# Patient Record
Sex: Male | Born: 1955 | Race: White | Hispanic: No | Marital: Married | Smoking: Never smoker
Health system: Southern US, Community
[De-identification: ages and names within clinical notes are randomized; demographics above are authoritative.]

## PROBLEM LIST (undated history)

## (undated) DIAGNOSIS — G4730 Sleep apnea, unspecified: Secondary | ICD-10-CM

## (undated) DIAGNOSIS — E785 Hyperlipidemia, unspecified: Secondary | ICD-10-CM

## (undated) DIAGNOSIS — I10 Essential (primary) hypertension: Secondary | ICD-10-CM

## (undated) DIAGNOSIS — E669 Obesity, unspecified: Secondary | ICD-10-CM

## (undated) HISTORY — DX: Hyperlipidemia, unspecified: E78.5

## (undated) HISTORY — PX: OTHER SURGICAL HISTORY: SHX169

## (undated) HISTORY — DX: Obesity, unspecified: E66.9

## (undated) HISTORY — DX: Sleep apnea, unspecified: G47.30

## (undated) HISTORY — DX: Essential (primary) hypertension: I10

---

## 1989-03-02 HISTORY — PX: ARTHROSCOPIC REPAIR ACL: SUR80

## 2005-04-02 ENCOUNTER — Ambulatory Visit: Payer: Self-pay | Admitting: Gastroenterology

## 2005-04-13 ENCOUNTER — Ambulatory Visit: Payer: Self-pay | Admitting: Gastroenterology

## 2005-04-13 ENCOUNTER — Encounter (INDEPENDENT_AMBULATORY_CARE_PROVIDER_SITE_OTHER): Payer: Self-pay | Admitting: *Deleted

## 2008-03-02 HISTORY — PX: SHOULDER SURGERY: SHX246

## 2008-04-24 ENCOUNTER — Emergency Department (HOSPITAL_COMMUNITY): Admission: EM | Admit: 2008-04-24 | Discharge: 2008-04-24 | Payer: Self-pay | Admitting: Emergency Medicine

## 2008-11-16 ENCOUNTER — Ambulatory Visit (HOSPITAL_BASED_OUTPATIENT_CLINIC_OR_DEPARTMENT_OTHER): Admission: RE | Admit: 2008-11-16 | Discharge: 2008-11-17 | Payer: Self-pay | Admitting: Specialist

## 2010-02-26 ENCOUNTER — Ambulatory Visit: Payer: Self-pay | Admitting: Cardiology

## 2010-06-06 LAB — POCT I-STAT 4, (NA,K, GLUC, HGB,HCT)
HCT: 46 % (ref 39.0–52.0)
Hemoglobin: 15.6 g/dL (ref 13.0–17.0)

## 2010-06-09 ENCOUNTER — Telehealth: Payer: Self-pay | Admitting: Cardiology

## 2010-06-09 NOTE — Telephone Encounter (Signed)
Fax: [phone]- Last office note, stress test and EKG

## 2010-06-10 ENCOUNTER — Ambulatory Visit (HOSPITAL_BASED_OUTPATIENT_CLINIC_OR_DEPARTMENT_OTHER)
Admission: RE | Admit: 2010-06-10 | Discharge: 2010-06-11 | Disposition: A | Discharge status: Home / Self Care | Payer: 59 | Source: Ambulatory Visit | Attending: Specialist | Admitting: Specialist

## 2010-06-10 ENCOUNTER — Telehealth: Payer: Self-pay | Admitting: Cardiology

## 2010-06-10 DIAGNOSIS — M19019 Primary osteoarthritis, unspecified shoulder: Secondary | ICD-10-CM | POA: Insufficient documentation

## 2010-06-10 DIAGNOSIS — M24119 Other articular cartilage disorders, unspecified shoulder: Secondary | ICD-10-CM | POA: Insufficient documentation

## 2010-06-10 DIAGNOSIS — M25819 Other specified joint disorders, unspecified shoulder: Secondary | ICD-10-CM | POA: Insufficient documentation

## 2010-06-10 DIAGNOSIS — Z01812 Encounter for preprocedural laboratory examination: Secondary | ICD-10-CM | POA: Insufficient documentation

## 2010-06-10 DIAGNOSIS — I10 Essential (primary) hypertension: Secondary | ICD-10-CM | POA: Insufficient documentation

## 2010-06-10 DIAGNOSIS — M9420 Chondromalacia, unspecified site: Secondary | ICD-10-CM | POA: Insufficient documentation

## 2010-06-10 DIAGNOSIS — Z01810 Encounter for preprocedural cardiovascular examination: Secondary | ICD-10-CM | POA: Insufficient documentation

## 2010-06-10 DIAGNOSIS — Z79899 Other long term (current) drug therapy: Secondary | ICD-10-CM | POA: Insufficient documentation

## 2010-06-10 DIAGNOSIS — G4733 Obstructive sleep apnea (adult) (pediatric): Secondary | ICD-10-CM | POA: Insufficient documentation

## 2010-06-10 LAB — POCT I-STAT 4, (NA,K, GLUC, HGB,HCT)
Glucose, Bld: 85 mg/dL (ref 70–99)
Potassium: 3.8 mEq/L (ref 3.5–5.1)

## 2010-06-10 NOTE — Telephone Encounter (Signed)
[phone] Office note, EKG, Stress test,

## 2010-06-25 ENCOUNTER — Other Ambulatory Visit: Payer: Self-pay | Admitting: Cardiology

## 2010-06-25 DIAGNOSIS — I10 Essential (primary) hypertension: Secondary | ICD-10-CM

## 2010-06-26 NOTE — Op Note (Signed)
Cody Blankenship, Cody Blankenship               ACCOUNT NO.:  [id number]  MEDICAL RECORD NO.:  [id number]          PATIENT TYPE:  LOCATION:                                 FACILITY:  PHYSICIAN:  Erasmo Leventhal, M.D. DATE OF BIRTH:  DATE OF PROCEDURE:  06/10/2010 DATE OF DISCHARGE:                              OPERATIVE REPORT   PREOPERATIVE DIAGNOSES:  Right shoulder superior labral tearing. Destabilized biceps anchor, impingement syndrome, possible cuff tear, anterior cruciate ligament arthritis.  POSTOPERATIVE DIAGNOSES: 1. Right shoulder extensive superior labral tearing with destabilized     biceps anchor, impingement syndrome, anterior cruciate ligament     arthritis. 2. Chondromalacia of humeral head grade 2.  PROCEDURES: 1. Right shoulder glenohumeral arthroscopy with intraarticular biceps     tenotomy. 2. Intraarticular labral debridement. 3. Arthroscopic subacromial decompression with acromioplasty and bursectomy and _CA ligament release. 4. Humeral head chondroplasty.  SURGEON:  Erasmo Leventhal, MD  ASSISTANT:  Jamelle Rushing, P.A.-C.  ANESTHESIA:  Interscalene block, general.  BLOOD LOSS:  Less than 10 cc.  DRAINS:  None.  COMPLICATIONS:  None.  DISPOSITION:  PACU, stable.  DETAILS:  The patient was counseled in the holding area, correct side was identified, IV was started, interscalene block was administered. Taken to the operating room, placed in supine position under general anesthesia, turned to left lateral decubitus position Right shoulder being examined, full range of motion.  He was prepped with DuraPrep and draped in sterile fashion.  Elevated shoulder holder was utilized, 30 degrees of abduction, 10 degrees full flexion and 25 longitudinal traction due to large size of his arm.  Posterior portal was created.  __________glenohumeral joint.  Diagnostic arthroscopy was undertaken.  Grade 2 chondromalacia of the humeral head with  unstable flaps, extensive tear at the superior labrum, anterior to posterior with destabilized biceps anchor. __________ appeared to be normal.  Anterior __________rotator cuff interval.  __________ was then reduced. A light synovectomy was performed for visualization.  The biceps tendon was released with basket as we have previously discussed and then smoothed down nicely with a shaver, also gave me __________ system.  The labrum was debrided from front to back with motorized shaver and smoothed down with cautery also.  At this point in time, the superior labrum was stable, it was not displaced through the joint, shoulder was stable, arthroscopic debris was removed.  He had a very thick subacromial bursa.  __________subacromial bursectomy performed.  Rotator cuff bursa was found to be intact except for some scuffing of the __________ arch, no frank tear.  ArthroCare System was used __________ past and see a ligament, __________then placed posteriorly and anterior inferior acromioplasty performed __________ [facility] joint was found to be markedly __________with underlying subclavicular spur.  A bur was then placed through the anterior portal into [facility] joint and the lateral 5 to 8 mm flap was removed circumferentially leaving the capsule intact.  Clavicle was palpated and found to be stable.  Arthroscopic debris was removed.  Hemostasis was obtained.  He was awakened, taken to the operating room and PACU in stable condition.  Taken out of traction, port was  closed with  __________suture.  He was turned supine, awakened, he was taken operating room and PACU in stable condition.          ______________________________ Erasmo Leventhal, M.D.     RAC/MEDQ  D:  06/10/2010  T:  06/11/2010  Job:  [id number]  Electronically Signed by Eugenia Mcalpine M.D. on 06/26/2010 09:29:14 AM

## 2010-12-29 ENCOUNTER — Other Ambulatory Visit: Payer: Self-pay | Admitting: Cardiology

## 2011-04-14 ENCOUNTER — Other Ambulatory Visit: Payer: Self-pay | Admitting: Surgery

## 2011-05-01 ENCOUNTER — Other Ambulatory Visit: Payer: Self-pay | Admitting: Cardiology

## 2011-05-01 NOTE — Telephone Encounter (Signed)
Patient needs to schedule appt with lori for further refills

## 2011-05-13 ENCOUNTER — Encounter: Payer: Self-pay | Admitting: *Deleted

## 2011-05-20 ENCOUNTER — Encounter: Payer: Self-pay | Admitting: *Deleted

## 2011-08-04 ENCOUNTER — Encounter: Payer: Self-pay | Admitting: Cardiology

## 2012-06-14 ENCOUNTER — Encounter: Payer: Self-pay | Admitting: Cardiology

## 2015-04-26 ENCOUNTER — Encounter: Payer: Self-pay | Admitting: Gastroenterology

## 2015-08-22 ENCOUNTER — Encounter: Payer: Self-pay | Admitting: Gastroenterology

## 2015-10-10 ENCOUNTER — Ambulatory Visit (AMBULATORY_SURGERY_CENTER): Payer: Self-pay | Admitting: *Deleted

## 2015-10-10 VITALS — Ht 67.0 in | Wt 260.0 lb

## 2015-10-10 DIAGNOSIS — Z1211 Encounter for screening for malignant neoplasm of colon: Secondary | ICD-10-CM

## 2015-10-10 MED ORDER — NA SULFATE-K SULFATE-MG SULF 17.5-3.13-1.6 GM/177ML PO SOLN: ORAL | 0 refills | Status: DC

## 2015-10-10 NOTE — Progress Notes (Signed)
Patient denies any problems with anesthesia/sedation. Patient denies any oxygen use at home and does not take any diet/weight loss medications.

## 2015-10-24 ENCOUNTER — Encounter: Payer: Self-pay | Admitting: Gastroenterology

## 2015-11-06 ENCOUNTER — Encounter: Payer: Self-pay | Admitting: Gastroenterology

## 2015-11-20 ENCOUNTER — Ambulatory Visit (AMBULATORY_SURGERY_CENTER): Payer: 59 | Admitting: Gastroenterology

## 2015-11-20 ENCOUNTER — Encounter: Payer: Self-pay | Admitting: Gastroenterology

## 2015-11-20 VITALS — BP 126/79 | HR 61 | Temp 98.4°F | Resp 10 | Ht 67.0 in | Wt 260.0 lb

## 2015-11-20 DIAGNOSIS — Z1211 Encounter for screening for malignant neoplasm of colon: Secondary | ICD-10-CM

## 2015-11-20 MED ORDER — SODIUM CHLORIDE 0.9 % IV SOLN: 500.0000 mL | INTRAVENOUS | Status: AC

## 2015-11-20 NOTE — Patient Instructions (Signed)
Handouts given on diverticulosis and hemorrhoids. Repeat colonoscopy in 10 years. Resume current medications. Call us with any questions or concerns. Thank you!!   YOU HAD AN ENDOSCOPIC PROCEDURE TODAY AT THE [facility]:   Refer to the procedure report that was given to you for any specific questions about what was found during the examination.  If the procedure report does not answer your questions, please call your gastroenterologist to clarify.  If you requested that your care partner not be given the details of your procedure findings, then the procedure report has been included in a sealed envelope for you to review at your convenience later.  YOU SHOULD EXPECT: Some feelings of bloating in the abdomen. Passage of more gas than usual.  Walking can help get rid of the air that was put into your GI tract during the procedure and reduce the bloating. If you had a lower endoscopy (such as a colonoscopy or flexible sigmoidoscopy) you may notice spotting of blood in your stool or on the toilet paper. If you underwent a bowel prep for your procedure, you may not have a normal bowel movement for a few days.  Please Note:  You might notice some irritation and congestion in your nose or some drainage.  This is from the oxygen used during your procedure.  There is no need for concern and it should clear up in a day or so.  SYMPTOMS TO REPORT IMMEDIATELY:   Following lower endoscopy (colonoscopy or flexible sigmoidoscopy):  Excessive amounts of blood in the stool  Significant tenderness or worsening of abdominal pains  Swelling of the abdomen that is new, acute  Fever of 100F or higher  For urgent or emergent issues, a gastroenterologist can be reached at any hour by calling [phone].   DIET:  We do recommend a small meal at first, but then you may proceed to your regular diet.  Drink plenty of fluids but you should avoid alcoholic beverages for 24 hours.  ACTIVITY:  You should  plan to take it easy for the rest of today and you should NOT DRIVE or use heavy machinery until tomorrow (because of the sedation medicines used during the test).    FOLLOW UP: Our staff will call the number listed on your records the next business day following your procedure to check on you and address any questions or concerns that you may have regarding the information given to you following your procedure. If we do not reach you, we will leave a message.  However, if you are feeling well and you are not experiencing any problems, there is no need to return our call.  We will assume that you have returned to your regular daily activities without incident.  If any biopsies were taken you will be contacted by phone or by letter within the next 1-3 weeks.  Please call us at [phone] if you have not heard about the biopsies in 3 weeks.    SIGNATURES/CONFIDENTIALITY: You and/or your care partner have signed paperwork which will be entered into your electronic medical record.  These signatures attest to the fact that that the information above on your After Visit Summary has been reviewed and is understood.  Full responsibility of the confidentiality of this discharge information lies with you and/or your care-partner.

## 2015-11-20 NOTE — Op Note (Signed)
[facility] Patient Name: Cody KittenBrian Lentz Procedure Date: 11/20/2015 9:21 AM MRN: [id number] Endoscopist: Napoleon FormKavitha V. Teola Felipe , MD Age: 60 Referring MD:  Date of Birth: 05/29/1955 Gender: Male Account #: [id number] Procedure:                Colonoscopy Indications:              Screening for colorectal malignant neoplasm Medicines:                Monitored Anesthesia Care Procedure:                Pre-Anesthesia Assessment:                           - Prior to the procedure, a History and Physical                            was performed, and patient medications and                            allergies were reviewed. The patient's tolerance of                            previous anesthesia was also reviewed. The risks                            and benefits of the procedure and the sedation                            options and risks were discussed with the patient.                            All questions were answered, and informed consent                            was obtained. Prior Anticoagulants: The patient has                            taken no previous anticoagulant or antiplatelet                            agents. ASA Grade Assessment: II - A patient with                            mild systemic disease. After reviewing the risks                            and benefits, the patient was deemed in                            satisfactory condition to undergo the procedure.                           After obtaining informed consent, the colonoscope  was passed under direct vision. Throughout the                            procedure, the patient's blood pressure, pulse, and                            oxygen saturations were monitored continuously. The                            Model CF-HQ190L [id number]) scope was introduced                            through the anus and advanced to the the cecum,                            identified by  appendiceal orifice and ileocecal                            valve. The colonoscopy was performed without                            difficulty. The patient tolerated the procedure                            well. The quality of the bowel preparation was                            good. The terminal ileum, ileocecal valve,                            appendiceal orifice, and rectum were photographed. Scope In: 9:25:10 AM Scope Out: 9:39:27 AM Scope Withdrawal Time: 0 hours 10 minutes 24 seconds  Total Procedure Duration: 0 hours 14 minutes 17 seconds  Findings:                 The perianal and digital rectal examinations were                            normal.                           Multiple small and large-mouthed diverticula were                            found in the sigmoid colon and descending colon.                            Peri-diverticular erythema was seen. Petechia were                            visualized in association with the diverticular                            opening. There was no evidence of diverticular  bleeding.                           Non-bleeding internal hemorrhoids were found during                            retroflexion. The hemorrhoids were small.                           The exam was otherwise without abnormality. Complications:            No immediate complications. Estimated Blood Loss:     Estimated blood loss: none. Impression:               - Moderate diverticulosis in the sigmoid colon and                            in the descending colon. Peri-diverticular erythema                            was seen. Petechia were visualized in association                            with the diverticular opening. There was no                            evidence of diverticular bleeding.                           - Non-bleeding internal hemorrhoids.                           - The examination was otherwise normal.                            - No specimens collected. Recommendation:           - Patient has a contact number available for                            emergencies. The signs and symptoms of potential                            delayed complications were discussed with the                            patient. Return to normal activities tomorrow.                            Written discharge instructions were provided to the                            patient.                           - Resume previous diet.                           -  Continue present medications.                           - Repeat colonoscopy in 10 years for screening                            purposes. Napoleon Form, MD 11/20/2015 9:54:22 AM This report has been signed electronically.

## 2015-11-21 ENCOUNTER — Telehealth: Payer: Self-pay | Admitting: *Deleted

## 2015-11-21 NOTE — Telephone Encounter (Signed)
  Follow up Call-  Call back number 11/20/2015  Post procedure Call Back phone  # [phone]  Permission to leave phone message Yes  Some recent data might be hidden     Patient questions:  Do you have a fever, pain , or abdominal swelling? No. Pain Score  0 *  Have you tolerated food without any problems? Yes.    Have you been able to return to your normal activities? Yes.    Do you have any questions about your discharge instructions: Diet   No. Medications  No. Follow up visit  No.  Do you have questions or concerns about your Care? No.  Actions: * If pain score is 4 or above: No action needed, pain <4.

## 2016-03-12 DIAGNOSIS — M75102 Unspecified rotator cuff tear or rupture of left shoulder, not specified as traumatic: Secondary | ICD-10-CM | POA: Diagnosis not present

## 2016-03-12 DIAGNOSIS — S43432A Superior glenoid labrum lesion of left shoulder, initial encounter: Secondary | ICD-10-CM | POA: Diagnosis not present

## 2016-03-12 DIAGNOSIS — M24012 Loose body in left shoulder: Secondary | ICD-10-CM | POA: Diagnosis not present

## 2016-03-12 DIAGNOSIS — G8918 Other acute postprocedural pain: Secondary | ICD-10-CM | POA: Diagnosis not present

## 2016-03-12 DIAGNOSIS — M7532 Calcific tendinitis of left shoulder: Secondary | ICD-10-CM | POA: Diagnosis not present

## 2016-03-12 DIAGNOSIS — M66812 Spontaneous rupture of other tendons, left shoulder: Secondary | ICD-10-CM | POA: Diagnosis not present

## 2016-03-12 DIAGNOSIS — M67912 Unspecified disorder of synovium and tendon, left shoulder: Secondary | ICD-10-CM | POA: Diagnosis not present

## 2016-03-20 DIAGNOSIS — M25512 Pain in left shoulder: Secondary | ICD-10-CM | POA: Diagnosis not present

## 2016-03-27 DIAGNOSIS — I10 Essential (primary) hypertension: Secondary | ICD-10-CM | POA: Diagnosis not present

## 2016-03-27 DIAGNOSIS — G4733 Obstructive sleep apnea (adult) (pediatric): Secondary | ICD-10-CM | POA: Diagnosis not present

## 2016-03-27 DIAGNOSIS — G4730 Sleep apnea, unspecified: Secondary | ICD-10-CM | POA: Diagnosis not present

## 2016-04-27 DIAGNOSIS — I10 Essential (primary) hypertension: Secondary | ICD-10-CM | POA: Diagnosis not present

## 2016-04-27 DIAGNOSIS — G4733 Obstructive sleep apnea (adult) (pediatric): Secondary | ICD-10-CM | POA: Diagnosis not present

## 2016-04-27 DIAGNOSIS — G4730 Sleep apnea, unspecified: Secondary | ICD-10-CM | POA: Diagnosis not present

## 2016-05-25 DIAGNOSIS — R05 Cough: Secondary | ICD-10-CM | POA: Diagnosis not present

## 2016-05-25 DIAGNOSIS — G4730 Sleep apnea, unspecified: Secondary | ICD-10-CM | POA: Diagnosis not present

## 2016-05-25 DIAGNOSIS — I10 Essential (primary) hypertension: Secondary | ICD-10-CM | POA: Diagnosis not present

## 2016-05-25 DIAGNOSIS — G4733 Obstructive sleep apnea (adult) (pediatric): Secondary | ICD-10-CM | POA: Diagnosis not present

## 2016-06-25 DIAGNOSIS — G4733 Obstructive sleep apnea (adult) (pediatric): Secondary | ICD-10-CM | POA: Diagnosis not present

## 2016-06-25 DIAGNOSIS — G4730 Sleep apnea, unspecified: Secondary | ICD-10-CM | POA: Diagnosis not present

## 2016-06-25 DIAGNOSIS — I10 Essential (primary) hypertension: Secondary | ICD-10-CM | POA: Diagnosis not present

## 2016-12-26 DIAGNOSIS — Z23 Encounter for immunization: Secondary | ICD-10-CM | POA: Diagnosis not present

## 2017-01-15 DIAGNOSIS — Z0000 Encounter for general adult medical examination without abnormal findings: Secondary | ICD-10-CM | POA: Diagnosis not present

## 2017-01-15 DIAGNOSIS — I10 Essential (primary) hypertension: Secondary | ICD-10-CM | POA: Diagnosis not present

## 2017-01-15 DIAGNOSIS — Z125 Encounter for screening for malignant neoplasm of prostate: Secondary | ICD-10-CM | POA: Diagnosis not present

## 2017-01-25 DIAGNOSIS — R7301 Impaired fasting glucose: Secondary | ICD-10-CM | POA: Diagnosis not present

## 2017-01-25 DIAGNOSIS — B3789 Other sites of candidiasis: Secondary | ICD-10-CM | POA: Diagnosis not present

## 2017-01-25 DIAGNOSIS — Z1389 Encounter for screening for other disorder: Secondary | ICD-10-CM | POA: Diagnosis not present

## 2017-01-25 DIAGNOSIS — Z0000 Encounter for general adult medical examination without abnormal findings: Secondary | ICD-10-CM | POA: Diagnosis not present

## 2017-01-25 DIAGNOSIS — R05 Cough: Secondary | ICD-10-CM | POA: Diagnosis not present

## 2017-01-25 DIAGNOSIS — Z125 Encounter for screening for malignant neoplasm of prostate: Secondary | ICD-10-CM | POA: Diagnosis not present

## 2017-01-26 DIAGNOSIS — G4733 Obstructive sleep apnea (adult) (pediatric): Secondary | ICD-10-CM | POA: Diagnosis not present

## 2017-01-26 DIAGNOSIS — G4730 Sleep apnea, unspecified: Secondary | ICD-10-CM | POA: Diagnosis not present

## 2017-02-05 DIAGNOSIS — Z1212 Encounter for screening for malignant neoplasm of rectum: Secondary | ICD-10-CM | POA: Diagnosis not present

## 2017-04-16 DIAGNOSIS — J029 Acute pharyngitis, unspecified: Secondary | ICD-10-CM | POA: Diagnosis not present

## 2017-04-16 DIAGNOSIS — R509 Fever, unspecified: Secondary | ICD-10-CM | POA: Diagnosis not present

## 2017-04-16 DIAGNOSIS — R05 Cough: Secondary | ICD-10-CM | POA: Diagnosis not present

## 2017-08-09 DIAGNOSIS — M25561 Pain in right knee: Secondary | ICD-10-CM | POA: Diagnosis not present

## 2017-08-09 DIAGNOSIS — M238X1 Other internal derangements of right knee: Secondary | ICD-10-CM | POA: Diagnosis not present

## 2017-08-17 DIAGNOSIS — M25561 Pain in right knee: Secondary | ICD-10-CM | POA: Diagnosis not present

## 2017-08-25 DIAGNOSIS — M1711 Unilateral primary osteoarthritis, right knee: Secondary | ICD-10-CM | POA: Diagnosis not present

## 2017-08-25 DIAGNOSIS — M2351 Chronic instability of knee, right knee: Secondary | ICD-10-CM | POA: Diagnosis not present

## 2017-09-22 DIAGNOSIS — M1711 Unilateral primary osteoarthritis, right knee: Secondary | ICD-10-CM | POA: Diagnosis not present

## 2017-10-13 DIAGNOSIS — M1711 Unilateral primary osteoarthritis, right knee: Secondary | ICD-10-CM | POA: Diagnosis not present

## 2017-11-27 DIAGNOSIS — Z23 Encounter for immunization: Secondary | ICD-10-CM | POA: Diagnosis not present

## 2018-01-20 DIAGNOSIS — R82998 Other abnormal findings in urine: Secondary | ICD-10-CM | POA: Diagnosis not present

## 2018-01-20 DIAGNOSIS — Z0000 Encounter for general adult medical examination without abnormal findings: Secondary | ICD-10-CM | POA: Diagnosis not present

## 2018-01-20 DIAGNOSIS — Z125 Encounter for screening for malignant neoplasm of prostate: Secondary | ICD-10-CM | POA: Diagnosis not present

## 2018-01-26 DIAGNOSIS — Z0000 Encounter for general adult medical examination without abnormal findings: Secondary | ICD-10-CM | POA: Diagnosis not present

## 2018-01-26 DIAGNOSIS — R7301 Impaired fasting glucose: Secondary | ICD-10-CM | POA: Diagnosis not present

## 2018-01-26 DIAGNOSIS — Z1389 Encounter for screening for other disorder: Secondary | ICD-10-CM | POA: Diagnosis not present

## 2018-01-26 DIAGNOSIS — R05 Cough: Secondary | ICD-10-CM | POA: Diagnosis not present

## 2018-01-26 DIAGNOSIS — Z1212 Encounter for screening for malignant neoplasm of rectum: Secondary | ICD-10-CM | POA: Diagnosis not present

## 2018-01-26 DIAGNOSIS — B379 Candidiasis, unspecified: Secondary | ICD-10-CM | POA: Diagnosis not present

## 2018-04-07 DIAGNOSIS — J209 Acute bronchitis, unspecified: Secondary | ICD-10-CM | POA: Diagnosis not present

## 2019-05-08 ENCOUNTER — Ambulatory Visit: Payer: 59 | Attending: Internal Medicine

## 2019-05-08 DIAGNOSIS — Z23 Encounter for immunization: Secondary | ICD-10-CM | POA: Insufficient documentation

## 2019-05-08 NOTE — Progress Notes (Signed)
   Covid-19 Vaccination Clinic  Name:  Cody Blankenship    MRN: [id number] DOB: 10/26/1955  05/08/2019  Mr. Cody Blankenship was observed post Covid-19 immunization for 15 minutes without incident. He was provided with Vaccine Information Sheet and instruction to access the V-Safe system.   Mr. Cody Blankenship was instructed to call 911 with any severe reactions post vaccine: Marland Kitchen Difficulty breathing  . Swelling of face and throat  . A fast heartbeat  . A bad rash all over body  . Dizziness and weakness   Immunizations Administered    Name Date Dose VIS Date Route   Pfizer COVID-19 Vaccine 05/08/2019  2:14 PM 0.3 mL 02/10/2019 Intramuscular   Manufacturer: [employer], [employer]   Lot: [id number]   NDC: [address]-2

## 2019-06-07 ENCOUNTER — Ambulatory Visit: Payer: 59 | Attending: Internal Medicine

## 2019-06-07 DIAGNOSIS — Z23 Encounter for immunization: Secondary | ICD-10-CM

## 2019-06-07 NOTE — Progress Notes (Signed)
   Covid-19 Vaccination Clinic  Name:  ORLIN KANN    MRN: [id number] DOB: [birth year]  06/07/2019  Mr. Decicco was observed post Covid-19 immunization for 15 minutes without incident. He was provided with Vaccine Information Sheet and instruction to access the V-Safe system.   Mr. Pagliarulo was instructed to call 911 with any severe reactions post vaccine: Marland Kitchen Difficulty breathing  . Swelling of face and throat  . A fast heartbeat  . A bad rash all over body  . Dizziness and weakness   Immunizations Administered    Name Date Dose VIS Date Route   Pfizer COVID-19 Vaccine 06/07/2019 12:38 PM 0.3 mL 02/10/2019 Intramuscular   Manufacturer: [employer], [employer]   Lot: [id number]   NDC: [address]-7

## 2020-04-10 ENCOUNTER — Other Ambulatory Visit: Payer: Self-pay | Admitting: Internal Medicine

## 2020-04-10 DIAGNOSIS — E785 Hyperlipidemia, unspecified: Secondary | ICD-10-CM

## 2020-05-01 ENCOUNTER — Ambulatory Visit
Admission: RE | Admit: 2020-05-01 | Discharge: 2020-05-01 | Disposition: A | Discharge status: Home / Self Care | Payer: No Typology Code available for payment source | Source: Ambulatory Visit | Attending: Internal Medicine | Admitting: Internal Medicine

## 2020-05-01 DIAGNOSIS — E785 Hyperlipidemia, unspecified: Secondary | ICD-10-CM

## 2021-06-05 ENCOUNTER — Encounter: Payer: Self-pay | Admitting: Pulmonary Disease

## 2021-06-05 ENCOUNTER — Ambulatory Visit: Payer: 59 | Admitting: Pulmonary Disease

## 2021-06-05 VITALS — BP 110/90 | HR 95 | Ht 65.5 in | Wt 274.6 lb

## 2021-06-05 DIAGNOSIS — R062 Wheezing: Secondary | ICD-10-CM | POA: Diagnosis not present

## 2021-06-05 DIAGNOSIS — J45909 Unspecified asthma, uncomplicated: Secondary | ICD-10-CM | POA: Insufficient documentation

## 2021-06-05 MED ORDER — ADVAIR HFA 115-21 MCG/ACT IN AERO: 2.0000 | INHALATION_SPRAY | Freq: Two times a day (BID) | RESPIRATORY_TRACT | 1 refills | Status: DC

## 2021-06-05 NOTE — Progress Notes (Addendum)
? ? ?Subjective:  ? ?PATIENT ID: Cody Blankenship GENDER: male DOB: 1955/04/12, MRN: [id number] ? ? ?HPI ? ?Chief Complaint  ?Patient presents with  ? Consult  ?  Wheezing over a yr  ? ? ?Reason for Visit: New consult for asthma ? ?Mr. Cody Blankenship is a 66 year old male with childhood asthma, OSA on CPAP, hyperlipidemia, hypertension who presents as a new consult for asthma. ? ?He reports childhood asthma that limited his ability to play sports until [employer]. He reports multiple hospitalizations until he aged out at 66 years old. No symptoms as a teenager or young adult. In the last years he develops wheezing during the day and night. Has noticed worsening congestion. Denies shortness of breath or cough. Denies chest tightness/pain. No symptoms with moderate or heavy exertion with lifting. Does have difficulty walking up steps due to his knees. He uses albuterol up to 1-2 times a day. He is compliant with his CPAP nightly. His last significant respiratory illness was Feb 2020 after [place], cannot recall if his symptoms started then. No covid test available then. ? ?Social History: ?Retired police ?Never smoker ?Second smoke exposure  ? ?Environmental exposures: None ? ?I have personally reviewed patient's past medical/family/social history, allergies, current medications. ? ?Past Medical History:  ?Diagnosis Date  ? Hyperlipidemia   ? Hypertension   ? Obesity   ? Sleep apnea   ? CPAP  ?  ? ?Family History  ?Problem Relation Age of Onset  ? Breast cancer Mother   ? Ovarian cancer Mother   ? Colon cancer Neg Hx   ?  ? ?Social History  ? ?Occupational History  ? Not on file  ?Tobacco Use  ? Smoking status: Never  ?  Passive exposure: Past  ? Smokeless tobacco: Never  ? Tobacco comments:  ?  Chew tobacco for a long time and stopped nov/dec 2022  ?Substance and Sexual Activity  ? Alcohol use: Yes  ?  Alcohol/week: 28.0 standard drinks  ?  Types: 28 Cans of beer per week  ? Drug use: No  ? Sexual activity: Not on file   ? ? ?Allergies  ?Allergen Reactions  ? Erythromycin Other (See Comments)  ?  Pt unsure reason  ?  ? ?Outpatient Medications Prior to Visit  ?Medication Sig Dispense Refill  ? atorvastatin (LIPITOR) 10 MG tablet Take 1 tablet by mouth daily.    ? losartan (COZAAR) 50 MG tablet Take 50 mg by mouth daily.    ? metoprolol succinate (TOPROL-XL) 50 MG 24 hr tablet Take 50 mg by mouth daily. Take with or immediately following a meal.    ? OZEMPIC, 1 MG/DOSE, 4 MG/3ML SOPN Inject 1 mg into the skin once a week.    ? tadalafil (CIALIS) 10 MG tablet Take 10 mg by mouth daily as needed for erectile dysfunction.    ? SYMBICORT 80-4.5 MCG/ACT inhaler SMARTSIG:2 Puff(s) By Mouth Twice Daily    ? aspirin 81 MG tablet Take 81 mg by mouth daily. (Patient not taking: Reported on 06/05/2021)    ? LIPITOR 10 MG tablet TAKE 1 TABLET BY MOUTH EVERY DAY 30 tablet 1  ? losartan (COZAAR) 50 MG tablet Take 2 tablets by mouth daily.    ? ?Facility-Administered Medications Prior to Visit  ?Medication Dose Route Frequency Provider Last Rate Last Admin  ? 0.9 %  sodium chloride infusion  500 mL Intravenous Continuous Nandigam, Eleonore Chiquito, MD      ? ? ?Review of Systems  ?  Constitutional:  Negative for chills, diaphoresis, fever, malaise/fatigue and weight loss.  ?HENT:  Positive for congestion.   ?Respiratory:  Positive for shortness of breath and wheezing. Negative for cough, hemoptysis and sputum production.   ?Cardiovascular:  Negative for chest pain, palpitations and leg swelling.  ? ? ?Objective:  ? ?Vitals:  ? 06/05/21 0925  ?BP: 110/90  ?Pulse: 95  ?SpO2: 98%  ?Weight: 274 lb 9.6 oz (124.6 kg)  ?Height: 5' 5.5" (1.664 m)  ? ?SpO2: 98 % ?O2 Device: None (Room air) ? ?Physical Exam: ?General: Well-appearing, no acute distress ?HENT: [place], AT ?Eyes: EOMI, no scleral icterus ?Respiratory: Clear to auscultation bilaterally.  No crackles, wheezing or rales ?Cardiovascular: RRR, -M/R/G, no JVD ?Extremities:-Edema,-tenderness ?Neuro: AAO x4, CNII-XII  grossly intact ?Psych: Normal mood, normal affect ? ?Data Reviewed: ? ?Imaging: ?CT cardiac 05/01/2020-calcium score 0.  Visualized lung fields with normal parenchyma.  No pulmonary nodules masses or infiltrates ? ?PFT: ?None on file ? ?Labs: ?CBC ?   ?Component Value Date/Time  ? HGB 15.0 06/10/2010 1307  ? HCT 44.0 06/10/2010 1307  ?OSH 04/10/21 Abs eos -300 ? ?Health Maintenance: ?Immunization History  ?Administered Date(s) Administered  ? Influenza,inj,Quad PF,6+ Mos 01/21/2016, 12/26/2016  ? PFIZER(Purple Top)SARS-COV-2 Vaccination 05/08/2019, 06/07/2019  ? Pneumococcal Polysaccharide-23 04/05/2020  ? Td 03/02/2010, 08/15/2010, 01/12/2012  ? ?CT Chest Lung Screen - not qualified ? ?   ?Assessment & Plan:  ? ?Discussion: ?66 year old male with OSA on CPAP, hyperlipidemia, hypertension who presents as a new consult for asthma. Discussed clinical course and management of asthma including bronchodilator regimen and action plan for exacerbation. ? ?Wheezing ?--ORDER pulmonary function tests ?--START Advair HFA 115-21 mcg TWO puffs TWICE a day ?--CONTINUE Albuterol AS NEEDED for wheezing ? ? ?Orders Placed This Encounter  ?Procedures  ? Pulmonary function test  ?  Standing Status:   Future  ?  Standing Expiration Date:   06/06/2022  ?  Order Specific Question:   Where should this test be performed?  ?  Answer:   [facility]  ?  Order Specific Question:   Full PFT: includes the following: basic spirometry, spirometry pre & post bronchodilator, diffusion capacity (DLCO), lung volumes  ?  Answer:   Full PFT  ? ?Meds ordered this encounter  ?Medications  ? fluticasone-salmeterol (ADVAIR HFA) 115-21 MCG/ACT inhaler  ?  Sig: Inhale 2 puffs into the lungs 2 (two) times daily.  ?  Dispense:  1 each  ?  Refill:  1  ? ? ?No follow-ups on file. After PFTs ? ?I have spent a total time of 45-minutes on the day of the appointment reviewing prior documentation, coordinating care and discussing medical diagnosis and plan with the  patient/family. Imaging, labs and tests included in this note have been reviewed and interpreted independently by me. ? ?Doryce Mcgregory Mechele Collin, MD ?[facility] ?06/05/2021 5:01 PM  ?Office Number [phone] ? ? ?

## 2021-06-05 NOTE — Patient Instructions (Signed)
?  Wheezing ?--ORDER pulmonary function tests ?--START Advair HFA 115-21 mcg TWO puffs TWICE a day ?--CONTINUE Albuterol AS NEEDED for wheezing ? ?Follow-up with me in May/June. Arrange PFTs prior to visit ?

## 2021-07-22 ENCOUNTER — Ambulatory Visit (INDEPENDENT_AMBULATORY_CARE_PROVIDER_SITE_OTHER): Payer: 59 | Admitting: Pulmonary Disease

## 2021-07-22 DIAGNOSIS — R062 Wheezing: Secondary | ICD-10-CM | POA: Diagnosis not present

## 2021-07-22 LAB — PULMONARY FUNCTION TEST
DL/VA % pred: 125 %
DL/VA: 5.3 ml/min/mmHg/L
DLCO cor % pred: 102 %
DLCO cor: 24.99 ml/min/mmHg
DLCO unc % pred: 102 %
DLCO unc: 24.99 ml/min/mmHg
FEF 25-75 Post: 2.95 L/sec
FEF 25-75 Pre: 2.15 L/sec
FEF2575-%Change-Post: 37 %
FEF2575-%Pred-Post: 117 %
FEF2575-%Pred-Pre: 85 %
FEV1-%Change-Post: 6 %
FEV1-%Pred-Post: 81 %
FEV1-%Pred-Pre: 76 %
FEV1-Post: 2.5 L
FEV1-Pre: 2.34 L
FEV1FVC-%Change-Post: 3 %
FEV1FVC-%Pred-Pre: 106 %
FEV6-%Change-Post: 3 %
FEV6-%Pred-Post: 77 %
FEV6-%Pred-Pre: 74 %
FEV6-Post: 3.01 L
FEV6-Pre: 2.91 L
FEV6FVC-%Change-Post: 0 %
FEV6FVC-%Pred-Post: 105 %
FEV6FVC-%Pred-Pre: 105 %
FVC-%Change-Post: 3 %
FVC-%Pred-Post: 73 %
FVC-%Pred-Pre: 71 %
FVC-Post: 3.01 L
FVC-Pre: 2.91 L
Post FEV1/FVC ratio: 83 %
Post FEV6/FVC ratio: 100 %
Pre FEV1/FVC ratio: 80 %
Pre FEV6/FVC Ratio: 100 %
RV % pred: 101 %
RV: 2.15 L
TLC % pred: 83 %
TLC: 5.24 L

## 2021-07-22 NOTE — Progress Notes (Signed)
PFT done today. 

## 2021-07-29 ENCOUNTER — Other Ambulatory Visit: Payer: Self-pay | Admitting: Pulmonary Disease

## 2021-07-30 ENCOUNTER — Encounter: Payer: Self-pay | Admitting: Pulmonary Disease

## 2021-07-30 ENCOUNTER — Ambulatory Visit: Payer: 59 | Admitting: Pulmonary Disease

## 2021-07-30 VITALS — BP 150/80 | HR 97 | Temp 98.2°F | Ht 66.0 in | Wt 275.0 lb

## 2021-07-30 DIAGNOSIS — J4530 Mild persistent asthma, uncomplicated: Secondary | ICD-10-CM

## 2021-07-30 NOTE — Progress Notes (Signed)
Subjective:   PATIENT ID: Cody Blankenship GENDER: male DOB: [birth year], MRN: [id number]   HPI  Chief Complaint  Patient presents with   Follow-up    Patient would like to go over PFT, otherwise doing good.    Reason for Visit: Follow-up  Mr. Geroge Gilliam is a 66 year old male with childhood asthma, OSA on CPAP, hyperlipidemia, hypertension who presents for asthma follow-up.  He reports childhood asthma that limited his ability to play sports until [place]. High. He reports multiple hospitalizations until he aged out at 66 years old. No symptoms as a teenager or young adult. In the last years he develops wheezing during the day and night. Has noticed worsening congestion. Denies shortness of breath or cough. Denies chest tightness/pain. No symptoms with moderate or heavy exertion with lifting. Does have difficulty walking up steps due to his knees. He uses albuterol up to 1-2 times a day. He is compliant with his CPAP nightly. His last significant respiratory illness was Feb 2020 after [place], cannot recall if his symptoms started then. No covid test available then.  07/30/21 Since last visit Advair has improved symptoms. He takes daily. Wheezing at night has improved. Able to perform ADLs. Has not used an albuterol. 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 Second smoke exposure   Past Medical History:  Diagnosis Date   Hyperlipidemia    Hypertension    Obesity    Sleep apnea    CPAP     Family History  Problem Relation Age of Onset   Breast cancer Mother    Ovarian cancer Mother    Colon cancer Neg Hx      Social History   Occupational History   Not on file  Tobacco Use   Smoking status: Never    Passive exposure: Past   Smokeless tobacco: Former    Types: Chew    Quit date: 02/09/2021   Tobacco comments:    Dorna Bloom tobacco for  a long time and stopped nov/dec 2022  Vaping Use   Vaping Use: Never used  Substance and Sexual Activity   Alcohol use: Yes    Alcohol/week: 28.0 standard drinks    Types: 28 Cans of beer per week   Drug use: No   Sexual activity: Not on file    Allergies  Allergen Reactions   Erythromycin Other (See Comments)    Pt unsure reason     Outpatient Medications Prior to Visit  Medication Sig Dispense Refill   ADVAIR HFA 115-21 MCG/ACT inhaler INHALE 2 PUFFS INTO THE LUNGS TWICE A DAY 12 each 1   aspirin 81 MG tablet Take 81 mg by mouth daily.     atorvastatin (LIPITOR) 10 MG tablet Take 1 tablet by mouth daily.     LIPITOR 10 MG tablet TAKE 1 TABLET BY MOUTH EVERY DAY 30 tablet 1   losartan (COZAAR) 50 MG tablet Take 50 mg by mouth daily.     losartan (COZAAR) 50 MG tablet Take 2 tablets by mouth daily.     metoprolol succinate (TOPROL-XL) 50 MG 24 hr tablet Take 50 mg by mouth daily. Take with or immediately following a meal.     OZEMPIC, 1 MG/DOSE, 4 MG/3ML SOPN Inject 1 mg into the skin once a week.     tadalafil (CIALIS) 10 MG tablet Take 10 mg  by mouth daily as needed for erectile dysfunction.     Facility-Administered Medications Prior to Visit  Medication Dose Route Frequency Provider Last Rate Last Admin   0.9 %  sodium chloride infusion  500 mL Intravenous Continuous Nandigam, Kavitha V, MD        Review of Systems  Constitutional:  Negative for chills, diaphoresis, fever, malaise/fatigue and weight loss.  HENT:  Negative for congestion.   Respiratory:  Positive for shortness of breath. Negative for cough, hemoptysis, sputum production and wheezing.   Cardiovascular:  Negative for chest pain, palpitations and leg swelling.    Objective:   Vitals:   07/30/21 0906  BP: (!) 150/80  Pulse: 97  Temp: 98.2 F (36.8 C)  TempSrc: Oral  SpO2: 95%  Weight: 275 lb (124.7 kg)  Height: 5\' 6"  (1.676 m)   SpO2: 95 % O2 Device: None (Room air)  Physical Exam: General:  Well-appearing, no acute distress HENT: [place], AT Eyes: EOMI, no scleral icterus Respiratory: Clear to auscultation bilaterally.  No crackles, wheezing or rales Cardiovascular: RRR, -M/R/G, no JVD Extremities:-Edema,-tenderness Neuro: AAO x4, CNII-XII grossly intact Psych: Normal mood, normal affect  Data Reviewed:  Imaging: CT cardiac 05/01/2020-calcium score 0.  Visualized lung fields with normal parenchyma.  No pulmonary nodules masses or infiltrates  PFT: 07/22/2021 FVC 3.01 (73%) FEV1 2.5 (81%) ratio 80 TLC 83% DLCO 102% Interpretation: Normal PFTs.  No obstructive or restrictive defect on spirometry.  No significant bronchodilator response however does not preclude benefit of therapy.  Labs: CBC    Component Value Date/Time   HGB 15.0 06/10/2010 1307   HCT 44.0 06/10/2010 1307  OSH 04/10/21 Abs eos -300  Health Maintenance: Immunization History  Administered Date(s) Administered   Influenza,inj,Quad PF,6+ Mos 01/21/2016, 12/26/2016   PFIZER(Purple Top)SARS-COV-2 Vaccination 05/08/2019, 06/07/2019, 12/30/2019, 09/04/2020, 12/22/2020   Pneumococcal Polysaccharide-23 04/05/2020   Td 03/02/2010, 08/15/2010, 01/12/2012   CT Chest Lung Screen - not qualified     Assessment & Plan:   Discussion: 66 year old male with OSA on CPAP, hyperlipidemia, hypertension presents for follow-up asthma follow-up.  PFTs reviewed which are normal however took Advair on day of testing.  Clinical presentation still consistent with asthma.  ICS/LABA with improved   Mild persistent asthma --CONTINUE Advair HFA 115-21 mcg TWO puffs TWICE a day --CONTINUE Albuterol AS NEEDED for wheezing  No orders of the defined types were placed in this encounter.  No orders of the defined types were placed in this encounter.   Return in about 1 year (around 07/31/2022).   I have spent a total time of 31-minutes on the day of the appointment including chart review, data review, collecting history, coordinating  care and discussing medical diagnosis and plan with the patient/family. Past medical history, allergies, medications were reviewed. Pertinent imaging, labs and tests included in this note have been reviewed and interpreted independently by me.  Aiden Helzer 08/02/2022, MD [facility] 07/30/2021 9:23 AM  Office Number [phone]

## 2021-07-30 NOTE — Patient Instructions (Signed)
  Mild persistent asthma --CONTINUE Advair HFA 115-21 mcg TWO puffs TWICE a day --CONTINUE Albuterol AS NEEDED for wheezing  Follow-up with me in 1 year

## 2021-08-25 ENCOUNTER — Other Ambulatory Visit: Payer: Self-pay | Admitting: Family Medicine

## 2021-08-25 DIAGNOSIS — M5030 Other cervical disc degeneration, unspecified cervical region: Secondary | ICD-10-CM

## 2021-08-30 ENCOUNTER — Ambulatory Visit
Admission: RE | Admit: 2021-08-30 | Discharge: 2021-08-30 | Disposition: A | Discharge status: Home / Self Care | Payer: 59 | Source: Ambulatory Visit | Attending: Family Medicine | Admitting: Family Medicine

## 2021-08-30 DIAGNOSIS — M5030 Other cervical disc degeneration, unspecified cervical region: Secondary | ICD-10-CM

## 2022-01-07 ENCOUNTER — Other Ambulatory Visit: Payer: Self-pay | Admitting: Pulmonary Disease

## 2022-06-24 ENCOUNTER — Other Ambulatory Visit: Payer: Self-pay | Admitting: Pulmonary Disease

## 2022-08-11 ENCOUNTER — Ambulatory Visit (HOSPITAL_BASED_OUTPATIENT_CLINIC_OR_DEPARTMENT_OTHER): Payer: 59 | Admitting: Pulmonary Disease

## 2022-08-11 ENCOUNTER — Encounter (HOSPITAL_BASED_OUTPATIENT_CLINIC_OR_DEPARTMENT_OTHER): Payer: Self-pay | Admitting: Pulmonary Disease

## 2022-08-11 VITALS — BP 110/80 | HR 94 | Temp 98.3°F | Ht 66.0 in | Wt 280.0 lb

## 2022-08-11 DIAGNOSIS — J4530 Mild persistent asthma, uncomplicated: Secondary | ICD-10-CM | POA: Diagnosis not present

## 2022-08-11 MED ORDER — FLUTICASONE-SALMETEROL 115-21 MCG/ACT IN AERO: 2.0000 | INHALATION_SPRAY | Freq: Two times a day (BID) | RESPIRATORY_TRACT | 11 refills | Status: DC

## 2022-08-11 NOTE — Progress Notes (Signed)
Subjective:   PATIENT ID: Cody Blankenship GENDER: male DOB: 09-Oct-1955, MRN: [id number]   HPI  Chief Complaint  Patient presents with   Follow-up    Follow up. Patient has no complaints.     Reason for Visit: Follow-up  Mr. Cody Blankenship is a 67 year old male with childhood asthma, OSA on CPAP, hyperlipidemia, hypertension who presents for asthma follow-up.  He reports childhood asthma that limited his ability to play sports until [place]. High. He reports multiple hospitalizations until he aged out at 67 years old. No symptoms as a teenager or young adult. In the last years he develops wheezing during the day and night. Has noticed worsening congestion. Denies shortness of breath or cough. Denies chest tightness/pain. No symptoms with moderate or heavy exertion with lifting. Does have difficulty walking up steps due to his knees. He uses albuterol up to 1-2 times a day. He is compliant with his CPAP nightly. His last significant respiratory illness was Feb 2020 after [place], cannot recall if his symptoms started then. No covid test available then.  07/30/21 Since last visit Advair has improved symptoms. He takes daily. Wheezing at night has improved. Able to perform ADLs. Has not used an albuterol. Has some shortness of breath with exertion but able to push through  08/11/22 He presents for our annual visit. Since our last visit he denies exacerbations however chronic steroids for polymyalgia rheumatica since 09/2021. Compliant with his Advair and never uses his rescue inhaler. He walks 1 mile twice a week. 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 smoke exposure   Past Medical History:  Diagnosis Date   Hyperlipidemia    Hypertension    Obesity    Sleep apnea    CPAP     Family History  Problem Relation Age of Onset   Breast cancer Mother    Ovarian  cancer Mother    Colon cancer Neg Hx      Social History   Occupational History   Not on file  Tobacco Use   Smoking status: Never    Passive exposure: Past   Smokeless tobacco: Former    Types: Chew    Quit date: 02/09/2021   Tobacco comments:    Dorna Bloom tobacco for a long time and stopped nov/dec 2022  Vaping Use   Vaping Use: Never used  Substance and Sexual Activity   Alcohol use: Yes    Alcohol/week: 28.0 standard drinks of alcohol    Types: 28 Cans of beer per week   Drug use: No   Sexual activity: Not on file    Allergies  Allergen Reactions   Erythromycin Other (See Comments)    Pt unsure reason     Outpatient Medications Prior to Visit  Medication Sig Dispense Refill   aspirin 81 MG tablet Take 81 mg by mouth daily.     atorvastatin (LIPITOR) 10 MG tablet Take 1 tablet by mouth daily.     LIPITOR 10 MG tablet TAKE 1 TABLET BY MOUTH EVERY DAY 30 tablet 1   losartan (COZAAR) 50 MG tablet Take 50 mg by mouth daily.     losartan (COZAAR) 50 MG tablet Take 2 tablets by mouth daily.     metoprolol succinate (TOPROL-XL) 50 MG 24 hr tablet Take 50 mg by mouth daily. Take with or immediately following a meal.  OZEMPIC, 1 MG/DOSE, 4 MG/3ML SOPN Inject 1 mg into the skin once a week.     tadalafil (CIALIS) 10 MG tablet Take 10 mg by mouth daily as needed for erectile dysfunction.     ADVAIR HFA 115-21 MCG/ACT inhaler INHALE 2 PUFFS INTO THE LUNGS TWICE A DAY 12 each 3   Facility-Administered Medications Prior to Visit  Medication Dose Route Frequency Provider Last Rate Last Admin   0.9 %  sodium chloride infusion  500 mL Intravenous Continuous Nandigam, Kavitha V, MD        Review of Systems  Constitutional:  Negative for chills, diaphoresis, fever, malaise/fatigue and weight loss.  HENT:  Negative for congestion.   Respiratory:  Negative for cough, hemoptysis, sputum production, shortness of breath and wheezing.   Cardiovascular:  Negative for chest pain,  palpitations and leg swelling.     Objective:   Vitals:   08/11/22 0823  BP: 110/80  Pulse: 94  Temp: 98.3 F (36.8 C)  TempSrc: Oral  SpO2: 96%  Weight: 280 lb (127 kg)  Height: 5\' 6"  (1.676 m)   SpO2: 96 % O2 Device: None (Room air)  Physical Exam: General: Well-appearing, no acute distress HENT: [place], AT Eyes: EOMI, no scleral icterus Respiratory: Clear to auscultation bilaterally.  No crackles, wheezing or rales Cardiovascular: RRR, -M/R/G, no JVD Extremities:-Edema,-tenderness Neuro: AAO x4, CNII-XII grossly intact Psych: Normal mood, normal affect  Data Reviewed:  Imaging: CT cardiac 05/01/2020-calcium score 0.  Visualized lung fields with normal parenchyma.  No pulmonary nodules masses or infiltrates  PFT: 07/22/2021 FVC 3.01 (73%) FEV1 2.5 (81%) ratio 80 TLC 83% DLCO 102% Interpretation: Normal PFTs.  No obstructive or restrictive defect on spirometry.  No significant bronchodilator response however does not preclude benefit of therapy.  Labs: CBC    Component Value Date/Time   HGB 15.0 06/10/2010 1307   HCT 44.0 06/10/2010 1307  OSH 04/10/21 Abs eos -300  Health Maintenance: Immunization History  Administered Date(s) Administered   Influenza,inj,Quad PF,6+ Mos 01/21/2016, 12/26/2016   PFIZER(Purple Top)SARS-COV-2 Vaccination 05/08/2019, 06/07/2019, 12/30/2019, 09/04/2020, 12/22/2020   Pneumococcal Polysaccharide-23 04/05/2020   Td 03/02/2010, 08/15/2010, 01/12/2012   CT Chest Lung Screen - not qualified     Assessment & Plan:   Discussion: 67 year old male with olymyalgia rheumatica with chronic steroids, OSA on CPAP, HLD, HTN who presents for follow-up. Will have symptoms when he misses his dose. No step down. Continue current regimen. Discussed clinical course and management of asthma including bronchodilator regimen, preventive care including vaccinations and action plan for exacerbation.  Symptoms are well-controlled however chronic prednisone  could be masking baseline symptoms. No changes to meds for now.  Mild persistent asthma --CONTINUE Advair HFA 115-21 mcg TWO puffs TWICE a day --CONTINUE Albuterol AS NEEDED for wheezing  Asthma Action Plan Increase Albuterol for worsening shortness of breath, wheezing and cough. If you symptoms do not improve in 24-48 hours, please our office for evaluation and/or prednisone taper.   No orders of the defined types were placed in this encounter.  Meds ordered this encounter  Medications   fluticasone-salmeterol (ADVAIR HFA) 115-21 MCG/ACT inhaler    Sig: Inhale 2 puffs into the lungs 2 (two) times daily.    Dispense:  12 g    Refill:  11    Return in about 1 year (around 08/11/2023).   I have spent a total time of 30-minutes on the day of the appointment including chart review, data review, collecting history, coordinating care and  discussing medical diagnosis and plan with the patient/family. Past medical history, allergies, medications were reviewed. Pertinent imaging, labs and tests included in this note have been reviewed and interpreted independently by me  Ebony Rickel Mechele Collin, MD [facility] 08/11/2022 8:46 AM  Office Number [phone]

## 2022-08-11 NOTE — Patient Instructions (Signed)
Mild persistent asthma --CONTINUE Advair HFA 115-21 mcg TWO puffs TWICE a day --CONTINUE Albuterol AS NEEDED for wheezing  Asthma Action Plan Increase Albuterol for worsening shortness of breath, wheezing and cough. If you symptoms do not improve in 24-48 hours, please our office for evaluation and/or prednisone taper.

## 2022-08-15 IMAGING — CT CT CARDIAC CORONARY ARTERY CALCIUM SCORE
3 series · 14 of 20 positions shown, 16 images · non-contrast
Comparison: None.

CLINICAL DATA: 62-year-old Caucasian male with history of
hyperlipidemia, hypertension and family history of heart disease.

EXAM:
CT CARDIAC CORONARY ARTERY CALCIUM SCORE
TECHNIQUE: Non-contrast imaging through the heart was performed using
prospective ECG gating. Image post processing was performed on an
independent workstation, allowing for quantitative analysis of the
heart and coronary arteries. Note that this exam targets the heart
and the chest was not imaged in its entirety.

[Series 2: calcium scoring 2.00 qr36 bestdiast 70% hrt calciu · axial · 0.40mm/px · z∈[+1684,+1754]mm · 4 of 59 slices shown]
[im 12/59  vessel]
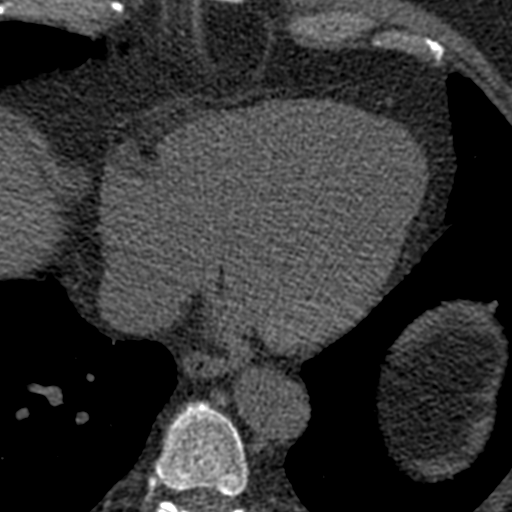
[im 24/59  vessel]
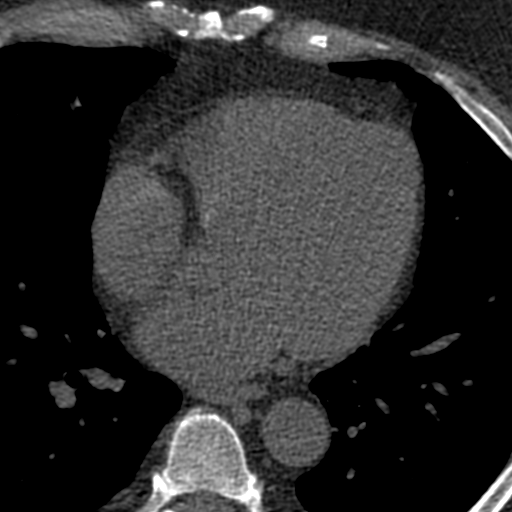
[im 35/59  vessel]
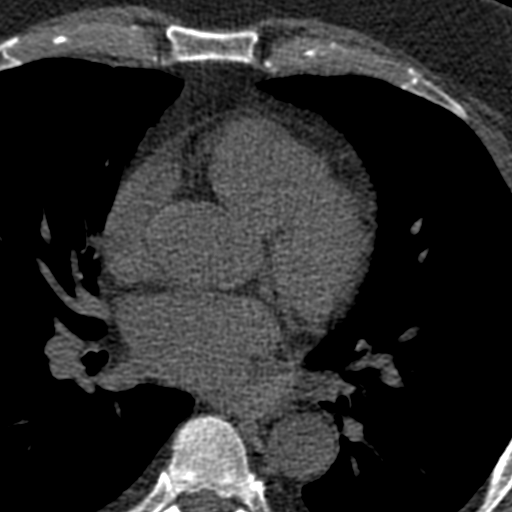
[im 47/59  vessel]
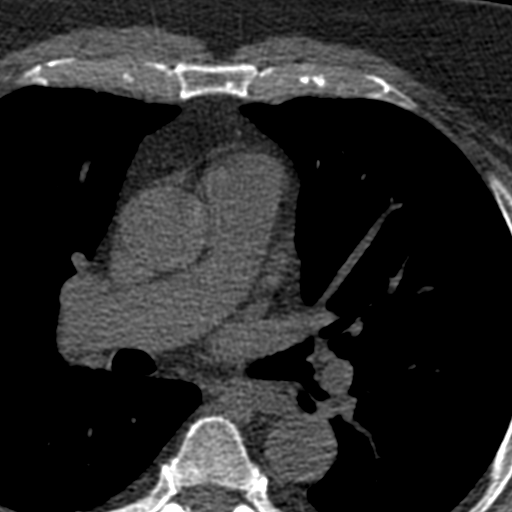

[Series 3: calcium scoring 2.00 br40 bestdiast 70% axial · axial · 0.64mm/px · z∈[+1678,+1758]mm · 5 of 60 slices shown, 7 images]
[im 10/60  vessel]
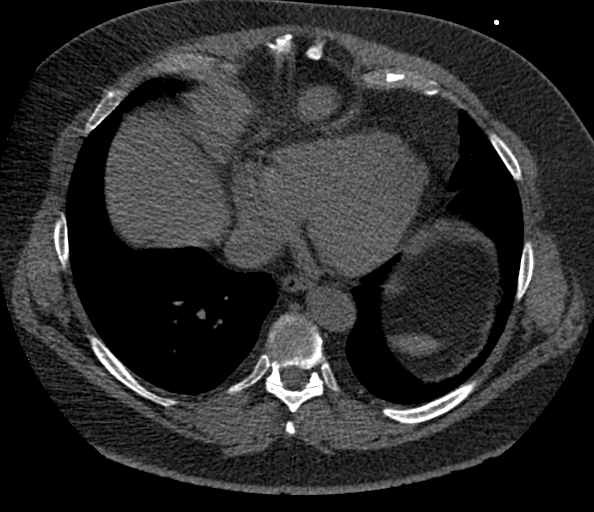
[im 10/60  lung]
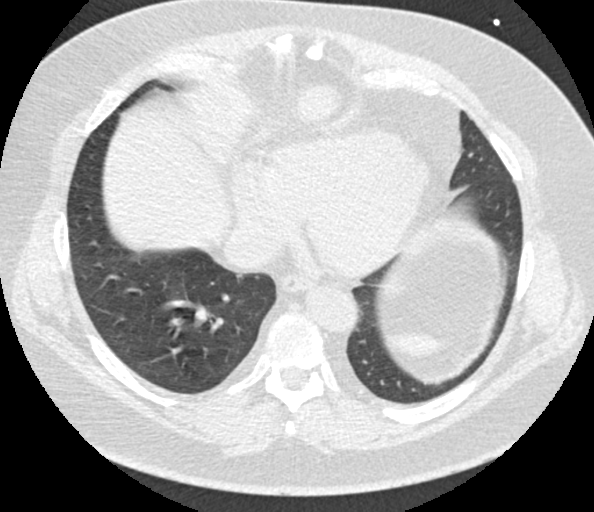
[im 20/60  vessel]
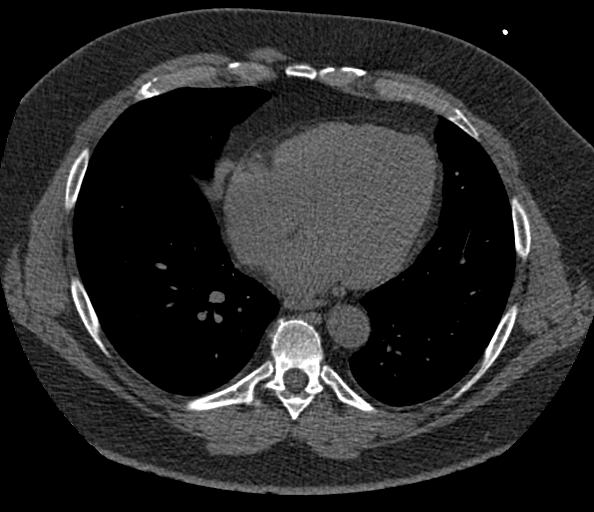
[im 30/60  vessel]
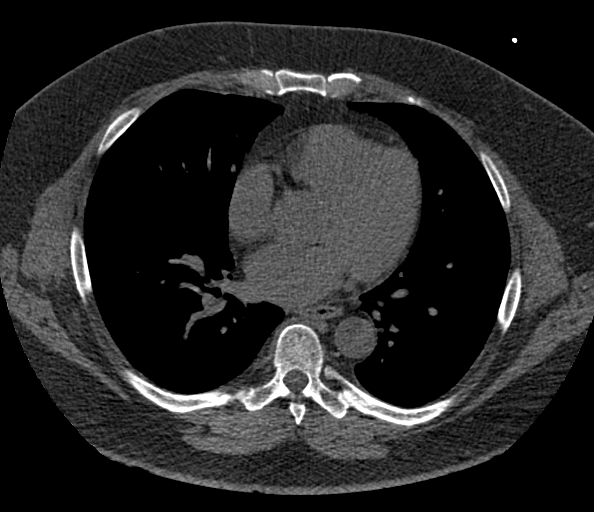
[im 40/60  vessel]
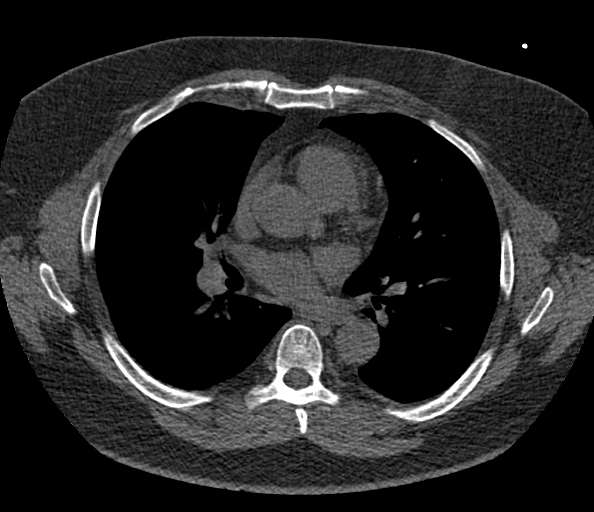
[im 50/60  vessel]
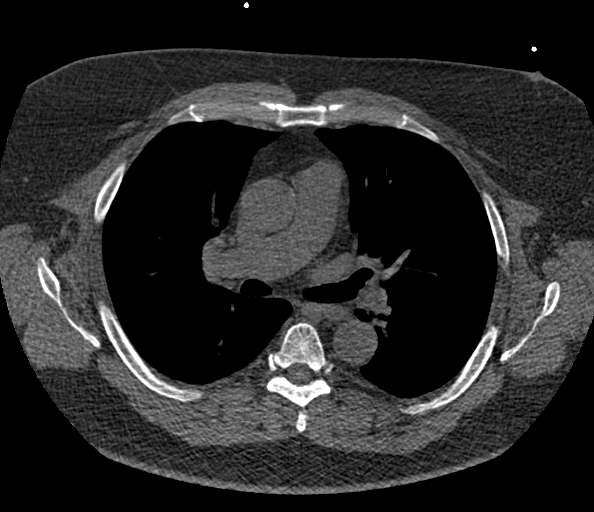
[im 50/60  lung]
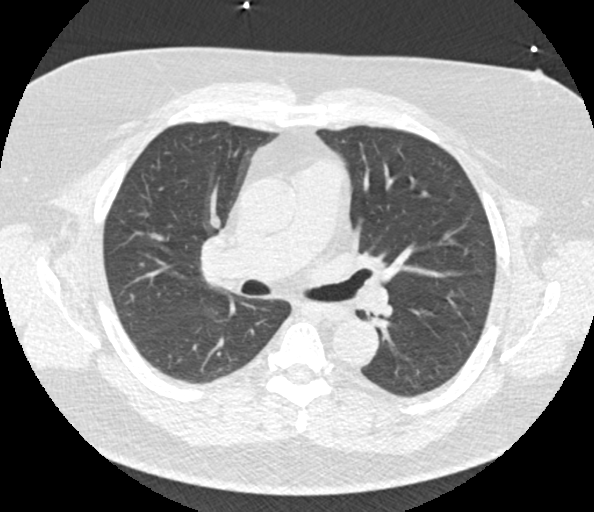

[Series 9: calcium scoring 2.00 br60 bestdiast 70% lungs · axial · 0.63mm/px · z∈[+1680,+1758]mm · 5 of 59 slices shown]
[im 10/59  vessel]
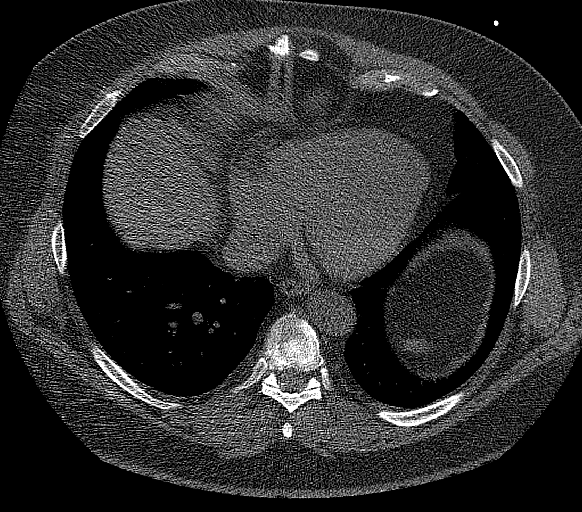
[im 20/59  vessel]
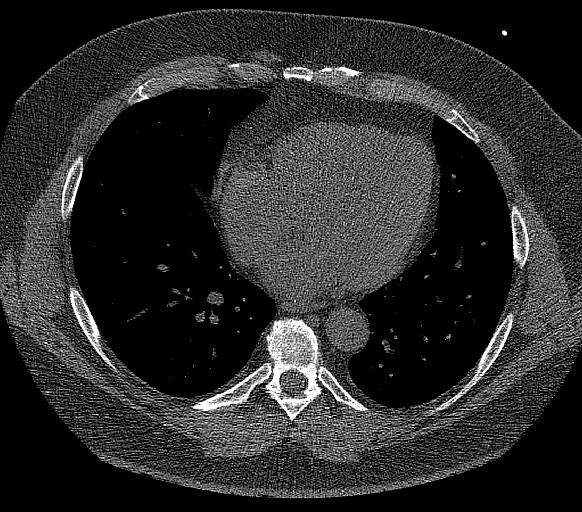
[im 30/59  vessel]
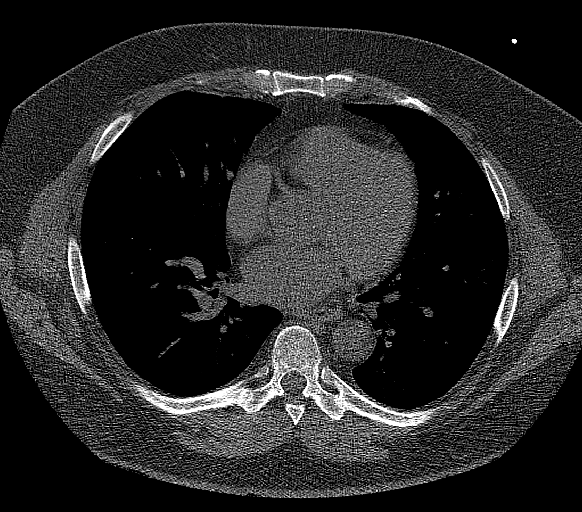
[im 39/59  vessel]
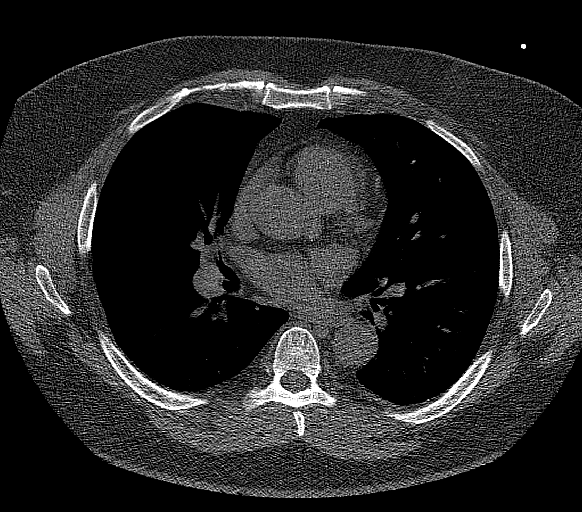
[im 49/59  vessel]
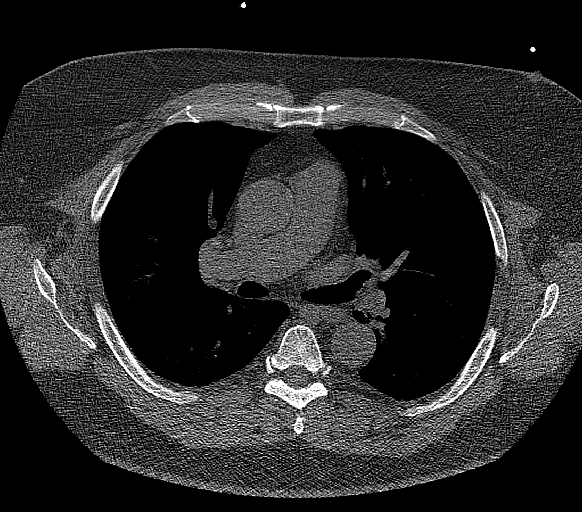

[14 of 20 positions shown; findings below may reference images not displayed]

FINDINGS: CORONARY CALCIUM SCORES:

Left Main: 0

LAD: 0

LCx: 0

RCA: 0

Total Agatston Score: 0

[HOSPITAL] percentile: 0

AORTA MEASUREMENTS:

Ascending Aorta: 34 mm

Descending Aorta: 26 mm

OTHER FINDINGS:

The heart size is within normal limits. No pericardial fluid
identified. Visualized segments of the thoracic aorta and central
pulmonary arteries are of normal caliber. Visualized mediastinum and
hilar regions demonstrate no lymphadenopathy or masses. Small
calcified granuloma present in the right middle lobe. There is no
evidence of pulmonary edema, consolidation, pneumothorax or pleural
fluid. Visualized upper abdomen and bony structures are
unremarkable.
IMPRESSION: Coronary calcium score of 0. Calcified granuloma of right middle
lobe.

## 2022-09-21 ENCOUNTER — Ambulatory Visit: Payer: Self-pay | Admitting: Internal Medicine

## 2022-10-12 ENCOUNTER — Ambulatory Visit (INDEPENDENT_AMBULATORY_CARE_PROVIDER_SITE_OTHER): Payer: Medicare HMO | Admitting: Internal Medicine

## 2022-10-12 ENCOUNTER — Encounter: Payer: Self-pay | Admitting: Internal Medicine

## 2022-10-12 VITALS — BP 136/90 | HR 86 | Ht 66.0 in | Wt 284.2 lb

## 2022-10-12 DIAGNOSIS — I10 Essential (primary) hypertension: Secondary | ICD-10-CM

## 2022-10-12 DIAGNOSIS — E782 Mixed hyperlipidemia: Secondary | ICD-10-CM

## 2022-10-12 DIAGNOSIS — G4733 Obstructive sleep apnea (adult) (pediatric): Secondary | ICD-10-CM | POA: Insufficient documentation

## 2022-10-12 NOTE — Progress Notes (Signed)
Established Patient Office Visit  Subjective:  Patient ID: Cody Blankenship, male    DOB: 10-Sep-1955  Age: 67 y.o. MRN: [id number]  Chief Complaint  Patient presents with   Establish Care    NPE    Patient comes in to discuss his CPAP equipment which she has been using for several years.  Patient was diagnosed with obstructive sleep apnea several years ago and since then he has been well-managed with his CPAP treatment.  Currently he is on 13 cm of water with a heated humidifier and is tolerating it well.  He has no complaints with his facemask or pressure settings.  He benefits from using it every night and wakes up refreshed with lots of energy. He is advised to continue his CPAP at the current settings. However he is ready to get a new machine since it is getting old and malfunctioning.  Will send a new prescription to [facility].    No other concerns at this time.   Past Medical History:  Diagnosis Date   Hyperlipidemia    Hypertension    Obesity    Sleep apnea    CPAP    Past Surgical History:  Procedure Laterality Date   ARTHROSCOPIC REPAIR ACL  1991   left knee   left knee arthroscopy     1988   left knee surgery     1978   SHOULDER SURGERY  2010    Social History   Socioeconomic History   Marital status: Married    Spouse name: Not on file   Number of children: Not on file   Years of education: Not on file   Highest education level: Not on file  Occupational History   Not on file  Tobacco Use   Smoking status: Never    Passive exposure: Past   Smokeless tobacco: Former    Types: Chew    Quit date: 02/09/2021   Tobacco comments:    Dorna Bloom tobacco for a long time and stopped nov/dec 2022  Vaping Use   Vaping status: Never Used  Substance and Sexual Activity   Alcohol use: Yes    Alcohol/week: 28.0 standard drinks of alcohol    Types: 28 Cans of beer per week   Drug use: No   Sexual activity: Not on file  Other Topics Concern   Not on file   Social History Narrative   Not on file   Social Determinants of Health   Financial Resource Strain: Not on file  Food Insecurity: Not on file  Transportation Needs: Not on file  Physical Activity: Not on file  Stress: Not on file  Social Connections: Not on file  Intimate Partner Violence: Not on file    Family History  Problem Relation Age of Onset   Breast cancer Mother    Ovarian cancer Mother    Colon cancer Neg Hx     Allergies  Allergen Reactions   Erythromycin Other (See Comments)    Pt unsure reason    Review of Systems  Constitutional: Negative.   HENT: Negative.    Eyes: Negative.   Respiratory: Negative.  Negative for cough and shortness of breath.   Cardiovascular: Negative.  Negative for chest pain, palpitations and leg swelling.  Gastrointestinal: Negative.  Negative for abdominal pain, constipation, diarrhea, heartburn, nausea and vomiting.  Genitourinary: Negative.  Negative for dysuria and flank pain.  Musculoskeletal: Negative.  Negative for joint pain and myalgias.  Skin: Negative.   Neurological: Negative.  Negative for dizziness and headaches.  Endo/Heme/Allergies: Negative.   Psychiatric/Behavioral: Negative.  Negative for depression and suicidal ideas. The patient is not nervous/anxious.        Objective:   BP (!) 136/90   Pulse 86   Ht 5\' 6"  (1.676 m)   Wt 284 lb 3.2 oz (128.9 kg)   SpO2 96%   BMI 45.87 kg/m   Vitals:   10/12/22 0957  BP: (!) 136/90  Pulse: 86  Height: 5\' 6"  (1.676 m)  Weight: 284 lb 3.2 oz (128.9 kg)  SpO2: 96%  BMI (Calculated): 45.89    Physical Exam Vitals and nursing note reviewed.  Constitutional:      Appearance: Normal appearance.  HENT:     Head: Normocephalic and atraumatic.     Nose: Nose normal.     Mouth/Throat:     Mouth: Mucous membranes are moist.     Pharynx: Oropharynx is clear.  Eyes:     Conjunctiva/sclera: Conjunctivae normal.     Pupils: Pupils are equal, round, and reactive to  light.  Cardiovascular:     Rate and Rhythm: Normal rate and regular rhythm.     Pulses: Normal pulses.     Heart sounds: Normal heart sounds.  Pulmonary:     Effort: Pulmonary effort is normal.     Breath sounds: Normal breath sounds.  Abdominal:     General: Bowel sounds are normal.     Palpations: Abdomen is soft.  Musculoskeletal:        General: Normal range of motion.     Cervical back: Normal range of motion.  Skin:    General: Skin is warm and dry.  Neurological:     General: No focal deficit present.     Mental Status: He is alert and oriented to person, place, and time.  Psychiatric:        Mood and Affect: Mood normal.        Behavior: Behavior normal.        Judgment: Judgment normal.      No results found for any visits on 10/12/22.  No results found for this or any previous visit (from the past 2160 hour(s)).    Assessment & Plan:  Prescription for a new CPAP equipment and supplies sent to [facility]. Problem List Items Addressed This Visit     OSA on CPAP - Primary   Essential hypertension, benign   Relevant Medications   rosuvastatin (CRESTOR) 10 MG tablet   Mixed hyperlipidemia   Relevant Medications   rosuvastatin (CRESTOR) 10 MG tablet    No follow-ups on file.   Total time spent: 25 minutes  Margaretann Loveless, MD  10/12/2022   This document may have been prepared by [facility] Voice Recognition software and as such may include unintentional dictation errors.

## 2022-10-13 DIAGNOSIS — H52223 Regular astigmatism, bilateral: Secondary | ICD-10-CM | POA: Diagnosis not present

## 2022-10-13 DIAGNOSIS — H524 Presbyopia: Secondary | ICD-10-CM | POA: Diagnosis not present

## 2022-10-13 DIAGNOSIS — H5203 Hypermetropia, bilateral: Secondary | ICD-10-CM | POA: Diagnosis not present

## 2022-10-22 DIAGNOSIS — G4733 Obstructive sleep apnea (adult) (pediatric): Secondary | ICD-10-CM | POA: Diagnosis not present

## 2022-11-22 DIAGNOSIS — G4733 Obstructive sleep apnea (adult) (pediatric): Secondary | ICD-10-CM | POA: Diagnosis not present

## 2022-11-27 DIAGNOSIS — Z6841 Body Mass Index (BMI) 40.0 and over, adult: Secondary | ICD-10-CM | POA: Diagnosis not present

## 2022-11-27 DIAGNOSIS — M2560 Stiffness of unspecified joint, not elsewhere classified: Secondary | ICD-10-CM | POA: Diagnosis not present

## 2022-11-27 DIAGNOSIS — M25519 Pain in unspecified shoulder: Secondary | ICD-10-CM | POA: Diagnosis not present

## 2022-11-27 DIAGNOSIS — M353 Polymyalgia rheumatica: Secondary | ICD-10-CM | POA: Diagnosis not present

## 2022-11-27 DIAGNOSIS — M79641 Pain in right hand: Secondary | ICD-10-CM | POA: Diagnosis not present

## 2022-11-27 DIAGNOSIS — M79642 Pain in left hand: Secondary | ICD-10-CM | POA: Diagnosis not present

## 2022-12-05 DIAGNOSIS — Z23 Encounter for immunization: Secondary | ICD-10-CM | POA: Diagnosis not present

## 2022-12-22 DIAGNOSIS — G4733 Obstructive sleep apnea (adult) (pediatric): Secondary | ICD-10-CM | POA: Diagnosis not present

## 2023-01-12 DIAGNOSIS — I10 Essential (primary) hypertension: Secondary | ICD-10-CM | POA: Diagnosis not present

## 2023-01-12 DIAGNOSIS — R5383 Other fatigue: Secondary | ICD-10-CM | POA: Diagnosis not present

## 2023-01-12 DIAGNOSIS — Z1152 Encounter for screening for COVID-19: Secondary | ICD-10-CM | POA: Diagnosis not present

## 2023-01-12 DIAGNOSIS — R0981 Nasal congestion: Secondary | ICD-10-CM | POA: Diagnosis not present

## 2023-01-12 DIAGNOSIS — J029 Acute pharyngitis, unspecified: Secondary | ICD-10-CM | POA: Diagnosis not present

## 2023-01-12 DIAGNOSIS — J309 Allergic rhinitis, unspecified: Secondary | ICD-10-CM | POA: Diagnosis not present

## 2023-01-12 DIAGNOSIS — J45909 Unspecified asthma, uncomplicated: Secondary | ICD-10-CM | POA: Diagnosis not present

## 2023-01-12 DIAGNOSIS — B349 Viral infection, unspecified: Secondary | ICD-10-CM | POA: Diagnosis not present

## 2023-01-14 DIAGNOSIS — I10 Essential (primary) hypertension: Secondary | ICD-10-CM | POA: Diagnosis not present

## 2023-01-14 DIAGNOSIS — F419 Anxiety disorder, unspecified: Secondary | ICD-10-CM | POA: Diagnosis not present

## 2023-01-14 DIAGNOSIS — J40 Bronchitis, not specified as acute or chronic: Secondary | ICD-10-CM | POA: Diagnosis not present

## 2023-01-14 DIAGNOSIS — R7301 Impaired fasting glucose: Secondary | ICD-10-CM | POA: Diagnosis not present

## 2023-01-14 DIAGNOSIS — E785 Hyperlipidemia, unspecified: Secondary | ICD-10-CM | POA: Diagnosis not present

## 2023-01-14 DIAGNOSIS — G4733 Obstructive sleep apnea (adult) (pediatric): Secondary | ICD-10-CM | POA: Diagnosis not present

## 2023-01-14 DIAGNOSIS — J45909 Unspecified asthma, uncomplicated: Secondary | ICD-10-CM | POA: Diagnosis not present

## 2023-01-14 DIAGNOSIS — M8580 Other specified disorders of bone density and structure, unspecified site: Secondary | ICD-10-CM | POA: Diagnosis not present

## 2023-01-14 DIAGNOSIS — B379 Candidiasis, unspecified: Secondary | ICD-10-CM | POA: Diagnosis not present

## 2023-01-14 DIAGNOSIS — M179 Osteoarthritis of knee, unspecified: Secondary | ICD-10-CM | POA: Diagnosis not present

## 2023-01-14 DIAGNOSIS — M353 Polymyalgia rheumatica: Secondary | ICD-10-CM | POA: Diagnosis not present

## 2023-01-22 DIAGNOSIS — G4733 Obstructive sleep apnea (adult) (pediatric): Secondary | ICD-10-CM | POA: Diagnosis not present

## 2023-01-23 DIAGNOSIS — G4733 Obstructive sleep apnea (adult) (pediatric): Secondary | ICD-10-CM | POA: Diagnosis not present

## 2023-02-03 DIAGNOSIS — I10 Essential (primary) hypertension: Secondary | ICD-10-CM | POA: Diagnosis not present

## 2023-02-03 DIAGNOSIS — M545 Low back pain, unspecified: Secondary | ICD-10-CM | POA: Diagnosis not present

## 2023-02-21 DIAGNOSIS — G4733 Obstructive sleep apnea (adult) (pediatric): Secondary | ICD-10-CM | POA: Diagnosis not present

## 2023-03-01 DIAGNOSIS — M353 Polymyalgia rheumatica: Secondary | ICD-10-CM | POA: Diagnosis not present

## 2023-03-01 DIAGNOSIS — M79642 Pain in left hand: Secondary | ICD-10-CM | POA: Diagnosis not present

## 2023-03-01 DIAGNOSIS — Z6841 Body Mass Index (BMI) 40.0 and over, adult: Secondary | ICD-10-CM | POA: Diagnosis not present

## 2023-03-01 DIAGNOSIS — M2560 Stiffness of unspecified joint, not elsewhere classified: Secondary | ICD-10-CM | POA: Diagnosis not present

## 2023-03-01 DIAGNOSIS — M25519 Pain in unspecified shoulder: Secondary | ICD-10-CM | POA: Diagnosis not present

## 2023-03-01 DIAGNOSIS — M79641 Pain in right hand: Secondary | ICD-10-CM | POA: Diagnosis not present

## 2023-03-24 DIAGNOSIS — G4733 Obstructive sleep apnea (adult) (pediatric): Secondary | ICD-10-CM | POA: Diagnosis not present

## 2023-04-24 DIAGNOSIS — G4733 Obstructive sleep apnea (adult) (pediatric): Secondary | ICD-10-CM | POA: Diagnosis not present

## 2023-04-26 DIAGNOSIS — G4733 Obstructive sleep apnea (adult) (pediatric): Secondary | ICD-10-CM | POA: Diagnosis not present

## 2023-05-10 DIAGNOSIS — M19011 Primary osteoarthritis, right shoulder: Secondary | ICD-10-CM | POA: Diagnosis not present

## 2023-05-10 DIAGNOSIS — M19012 Primary osteoarthritis, left shoulder: Secondary | ICD-10-CM | POA: Diagnosis not present

## 2023-05-11 ENCOUNTER — Telehealth (HOSPITAL_BASED_OUTPATIENT_CLINIC_OR_DEPARTMENT_OTHER): Payer: Self-pay | Admitting: Pulmonary Disease

## 2023-05-11 NOTE — Telephone Encounter (Signed)
 Are you okay with sending Wixella?

## 2023-05-12 MED ORDER — FLUTICASONE-SALMETEROL 115-21 MCG/ACT IN AERO: 2.0000 | INHALATION_SPRAY | Freq: Two times a day (BID) | RESPIRATORY_TRACT | 8 refills | Status: DC

## 2023-05-12 NOTE — Telephone Encounter (Signed)
 Rx sent to pharmacy

## 2023-05-12 NOTE — Telephone Encounter (Signed)
 Ok to send generic fluticasone salmeterol 115-21 for 8 refills. Will need follow-up with me in Sept/October

## 2023-05-17 ENCOUNTER — Telehealth (HOSPITAL_BASED_OUTPATIENT_CLINIC_OR_DEPARTMENT_OTHER): Payer: Self-pay | Admitting: Pulmonary Disease

## 2023-05-17 NOTE — Telephone Encounter (Signed)
 Patient came in person stating he may have told us what he was needing incorrectly. Patient states fluticasone salmeterol 115/21 is going to cost him $121 per month. But according to Medicare 100/50 or the 200/50 would only be $5. Please advise if this is possible or continue with the generic for the patient? Or should a test run though insurance be done?   Please advise.  Pharmacy [facility] [address] in [place]   Pt #: [phone]

## 2023-05-17 NOTE — Telephone Encounter (Signed)
 Please advise on dosage change.

## 2023-05-18 MED ORDER — FLUTICASONE-SALMETEROL 100-50 MCG/ACT IN AEPB: 1.0000 | INHALATION_SPRAY | Freq: Two times a day (BID) | RESPIRATORY_TRACT | 5 refills | Status: DC

## 2023-05-18 NOTE — Telephone Encounter (Signed)
 Wixela 100-50 ordered. Advised patient to call if pricing is not correct. May need to order "generic fluticasone-salmeterol 100-50" if it needs to be corrected.

## 2023-05-22 DIAGNOSIS — G4733 Obstructive sleep apnea (adult) (pediatric): Secondary | ICD-10-CM | POA: Diagnosis not present

## 2023-06-22 DIAGNOSIS — G4733 Obstructive sleep apnea (adult) (pediatric): Secondary | ICD-10-CM | POA: Diagnosis not present

## 2023-06-23 ENCOUNTER — Encounter: Payer: Self-pay | Admitting: Orthopedic Surgery

## 2023-06-23 ENCOUNTER — Other Ambulatory Visit: Payer: Self-pay | Admitting: Orthopedic Surgery

## 2023-06-23 DIAGNOSIS — M19011 Primary osteoarthritis, right shoulder: Secondary | ICD-10-CM

## 2023-06-25 ENCOUNTER — Ambulatory Visit
Admission: RE | Admit: 2023-06-25 | Discharge: 2023-06-25 | Disposition: A | Discharge status: Home / Self Care | Source: Ambulatory Visit | Attending: Orthopedic Surgery | Admitting: Orthopedic Surgery

## 2023-06-25 DIAGNOSIS — M19011 Primary osteoarthritis, right shoulder: Secondary | ICD-10-CM

## 2023-07-06 DIAGNOSIS — M25511 Pain in right shoulder: Secondary | ICD-10-CM | POA: Diagnosis not present

## 2023-07-06 DIAGNOSIS — R29898 Other symptoms and signs involving the musculoskeletal system: Secondary | ICD-10-CM | POA: Diagnosis not present

## 2023-07-06 DIAGNOSIS — M25611 Stiffness of right shoulder, not elsewhere classified: Secondary | ICD-10-CM | POA: Diagnosis not present

## 2023-07-21 DIAGNOSIS — M19011 Primary osteoarthritis, right shoulder: Secondary | ICD-10-CM | POA: Diagnosis not present

## 2023-07-22 DIAGNOSIS — G4733 Obstructive sleep apnea (adult) (pediatric): Secondary | ICD-10-CM | POA: Diagnosis not present

## 2023-07-25 DIAGNOSIS — G4733 Obstructive sleep apnea (adult) (pediatric): Secondary | ICD-10-CM | POA: Diagnosis not present

## 2023-07-27 DIAGNOSIS — M25519 Pain in unspecified shoulder: Secondary | ICD-10-CM | POA: Diagnosis not present

## 2023-07-27 DIAGNOSIS — M353 Polymyalgia rheumatica: Secondary | ICD-10-CM | POA: Diagnosis not present

## 2023-07-27 DIAGNOSIS — M79641 Pain in right hand: Secondary | ICD-10-CM | POA: Diagnosis not present

## 2023-07-27 DIAGNOSIS — M79642 Pain in left hand: Secondary | ICD-10-CM | POA: Diagnosis not present

## 2023-07-27 DIAGNOSIS — M2560 Stiffness of unspecified joint, not elsewhere classified: Secondary | ICD-10-CM | POA: Diagnosis not present

## 2023-07-27 DIAGNOSIS — Z6841 Body Mass Index (BMI) 40.0 and over, adult: Secondary | ICD-10-CM | POA: Diagnosis not present

## 2023-07-29 DIAGNOSIS — Z1212 Encounter for screening for malignant neoplasm of rectum: Secondary | ICD-10-CM | POA: Diagnosis not present

## 2023-07-29 DIAGNOSIS — E785 Hyperlipidemia, unspecified: Secondary | ICD-10-CM | POA: Diagnosis not present

## 2023-08-01 HISTORY — PX: DG C-ARM 2 VIEW RIGHT SHOULDER (ARMC HX): HXRAD1461

## 2023-08-04 DIAGNOSIS — R82998 Other abnormal findings in urine: Secondary | ICD-10-CM | POA: Diagnosis not present

## 2023-08-20 DIAGNOSIS — M19011 Primary osteoarthritis, right shoulder: Secondary | ICD-10-CM | POA: Diagnosis not present

## 2023-08-20 DIAGNOSIS — G8918 Other acute postprocedural pain: Secondary | ICD-10-CM | POA: Diagnosis not present

## 2023-08-22 DIAGNOSIS — G4733 Obstructive sleep apnea (adult) (pediatric): Secondary | ICD-10-CM | POA: Diagnosis not present

## 2023-09-08 DIAGNOSIS — Z5189 Encounter for other specified aftercare: Secondary | ICD-10-CM | POA: Diagnosis not present

## 2023-09-08 DIAGNOSIS — Z96611 Presence of right artificial shoulder joint: Secondary | ICD-10-CM | POA: Diagnosis not present

## 2023-09-21 DIAGNOSIS — G4733 Obstructive sleep apnea (adult) (pediatric): Secondary | ICD-10-CM | POA: Diagnosis not present

## 2023-09-21 DIAGNOSIS — M6281 Muscle weakness (generalized): Secondary | ICD-10-CM | POA: Diagnosis not present

## 2023-09-21 DIAGNOSIS — M25511 Pain in right shoulder: Secondary | ICD-10-CM | POA: Diagnosis not present

## 2023-09-21 DIAGNOSIS — M25611 Stiffness of right shoulder, not elsewhere classified: Secondary | ICD-10-CM | POA: Diagnosis not present

## 2023-10-11 NOTE — Progress Notes (Addendum)
 Subjective:   PATIENT ID: Cody Blankenship GENDER: male DOB: 02-23-1956, MRN: [id number]   HPI  Chief Complaint  Patient presents with   Follow-up    Asthma     Reason for Visit: Follow-up  Cody Blankenship is a 68 year old male with childhood asthma, OSA on CPAP, hyperlipidemia, hypertension who presents for asthma follow-up.  He reports childhood asthma that limited his ability to play sports until [employer]. He reports multiple hospitalizations until he aged out at 68 years old. No symptoms as a teenager or young adult. In the last years he develops wheezing during the day and night. Has noticed worsening congestion. Denies shortness of breath or cough. Denies chest tightness/pain. No symptoms with moderate or heavy exertion with lifting. Does have difficulty walking up steps due to his knees. He uses albuterol up to 1-2 times a day. He is compliant with his CPAP nightly. His last significant respiratory illness was Feb 2020 after [place], cannot recall if his symptoms started then. No covid test available then.  07/30/21 Since last visit Advair  has improved symptoms. He takes daily. Wheezing at night has improved. Able to perform ADLs. Has not used an albuterol. Has some shortness of breath with exertion but able to push through  08/11/22 He presents for our annual visit. Since our last visit he denies exacerbations however chronic steroids for polymyalgia rheumatica since 09/2021. Compliant with his Advair  and never uses his rescue inhaler. He walks 1 mile twice a week. No limitation in activity  10/12/23 Since our last annual visit he reports asthma is overall well controlled. Uses Wixela 100 once a day. Does not use rescue inhaler. Denies chronic shortness of breath, wheezing or cough. He had surgery for right shoulder replacement in June and post anesthesia had some breathing issues but has since recovered. Currently followed by Dr. Fernand for OSA and currently on his 3rd CPAP  machine. Requesting to switch over for our office to take over for our care. DME [facility]. His last sleep study was 17 years ago in [place]. Wears CPAP 9 hours nightly and reports improved quality of sleep.  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 smoker Second smoke exposure   Past Medical History:  Diagnosis Date   Hyperlipidemia    Hypertension    Obesity    Sleep apnea    CPAP     Family History  Problem Relation Age of Onset   Breast cancer Mother    Ovarian cancer Mother    Colon cancer Neg Hx      Social History   Occupational History   Not on file  Tobacco Use   Smoking status: Never    Passive exposure: Past   Smokeless tobacco: Former    Types: Chew    Quit date: 02/09/2021   Tobacco comments:    [place] tobacco for a long time and stopped nov/dec 2022  Vaping Use   Vaping status: Never Used  Substance and Sexual Activity   Alcohol  use: Yes    Alcohol /week: 28.0 standard drinks of alcohol     Types: 28 Cans of beer per week   Drug use: No   Sexual activity: Not on file    Allergies  Allergen Reactions   Erythromycin Other (See Comments)    Pt unsure reason     Outpatient Medications Prior to Visit  Medication Sig Dispense Refill   fluticasone -salmeterol (WIXELA INHUB) 100-50 MCG/ACT AEPB Inhale 1 puff into the lungs 2 (two) times daily. 60 each 5   losartan (COZAAR) 50 MG tablet Take 50 mg by mouth daily.     metoprolol succinate (TOPROL-XL) 50 MG 24 hr tablet Take 100 mg by mouth daily. Take with or immediately following a meal.     rosuvastatin (CRESTOR) 10 MG tablet Take 10 mg by mouth daily.     tadalafil (CIALIS) 10 MG tablet Take 10 mg by mouth daily as needed for erectile dysfunction.     aspirin 81 MG tablet Take 81 mg by mouth daily. (Patient not taking: Reported on 10/12/2023)     atorvastatin (LIPITOR) 10 MG tablet Take 1 tablet by  mouth daily. (Patient not taking: Reported on 10/12/2023)     LIPITOR 10 MG tablet TAKE 1 TABLET BY MOUTH EVERY DAY (Patient not taking: Reported on 10/12/2023) 30 tablet 1   losartan (COZAAR) 50 MG tablet Take 2 tablets by mouth daily. (Patient not taking: Reported on 10/12/2023)     predniSONE (DELTASONE) 1 MG tablet Take 1 mg by mouth daily. (Patient not taking: Reported on 10/12/2023)     OZEMPIC, 1 MG/DOSE, 4 MG/3ML SOPN Inject 1 mg into the skin once a week. (Patient not taking: Reported on 10/12/2023)     Facility-Administered Medications Prior to Visit  Medication Dose Route Frequency Provider Last Rate Last Admin   0.9 %  sodium chloride  infusion  500 mL Intravenous Continuous Nandigam, Kavitha V, MD        Review of Systems  Constitutional:  Negative for chills, diaphoresis, fever, malaise/fatigue and weight loss.  HENT:  Negative for congestion.   Respiratory:  Negative for cough, hemoptysis, sputum production, shortness of breath and wheezing.   Cardiovascular:  Negative for chest pain, palpitations and leg swelling.     Objective:   Vitals:   10/12/23 0832  BP: (!) 148/90  Pulse: 81  SpO2: 94%  Weight: 283 lb 3.2 oz (128.5 kg)  Height: 5' 6 (1.676 m)    SpO2: 94 %  Physical Exam: General: Well-appearing, no acute distress HENT: [place], AT Eyes: EOMI, no scleral icterus Respiratory: Clear to auscultation bilaterally.  No crackles, wheezing or rales Cardiovascular: RRR, -M/R/G, no JVD Extremities:-Edema,-tenderness Neuro: AAO x4, CNII-XII grossly intact Psych: Normal mood, normal affect  Data Reviewed:  Imaging: CT cardiac 05/01/2020-calcium score 0.  Visualized lung fields with normal parenchyma.  No pulmonary nodules masses or infiltrates  PFT: 07/22/2021 FVC 3.01 (73%) FEV1 2.5 (81%) ratio 80 TLC 83% DLCO 102% Interpretation: Normal PFTs.  No obstructive or restrictive defect on spirometry.  No significant bronchodilator response however does not preclude benefit  of therapy.  Labs: CBC    Component Value Date/Time   HGB 15.0 06/10/2010 1307   HCT 44.0 06/10/2010 1307  OSH 04/10/21 Abs eos -300  Health Maintenance: Immunization History  Administered Date(s) Administered   Influenza,inj,Quad PF,6+ Mos 01/21/2016, 12/26/2016   PFIZER(Purple Top)SARS-COV-2 Vaccination 05/08/2019, 06/07/2019, 12/30/2019, 09/04/2020, 12/22/2020   Pneumococcal Polysaccharide-23 04/05/2020   Td 03/02/2010, 08/15/2010, 01/12/2012   CT Chest Lung Screen - not qualified     Assessment & Plan:   Discussion: 68 year old male with polymyalgia rheumatica with chronic steroids, OSA on CPAP, HLD, HTN who presents for follow-up. Well controlled on low dose ICS/LABA. Continue current regimen. Discussed clinical course and management of asthma including bronchodilator regimen, preventive care including action plan for exacerbation.  Symptoms are  well-controlled however chronic prednisone could be masking baseline symptoms. No changes to meds for now.  Mild persistent asthma - well controlled --CONTINUE Wixela 100-50 mcg ONE puff ONCE a day --CONTINUE Albuterol AS NEEDED for wheezing  Asthma Action Plan Increase Albuterol for worsening shortness of breath, wheezing and cough. If you symptoms do not improve in 24-48 hours, please our office for evaluation and/or prednisone taper.  OSA on CPAP Last sleep study >10 years ago Current CPAP device since 2024 DME: [facility] Request sleep study from [facility] and/or from Dr. Fernand [facility]   Addendum 10/29/23: Received records. Summarized as follows -   [facility]. Split night 04/19/2007 - Severe OSA with nadir 72%. CPAP 13 cm H2O recommended.    CPAP Compliance 07/14/23-10/11/23   Usage days 90/90 (100%)   >4h 90/90 (100%)   AVG hours usage 9h 41 min   CPAP 13 cm H2O   AHI 3.1   No orders of the defined types were placed in this encounter.  No orders of the defined types were placed in  this encounter.   Return in about 6 months (around 04/13/2024).   I have spent a total time of 30-minutes on the day of the appointment including chart review, data review, collecting history, coordinating care and discussing medical diagnosis and plan with the patient/family. Past medical history, allergies, medications were reviewed. Pertinent imaging, labs and tests included in this note have been reviewed and interpreted independently by me.  Beth Spackman Slater Staff, MD [facility] 10/12/2023 9:38 AM

## 2023-10-12 ENCOUNTER — Ambulatory Visit (HOSPITAL_BASED_OUTPATIENT_CLINIC_OR_DEPARTMENT_OTHER): Admitting: Pulmonary Disease

## 2023-10-12 ENCOUNTER — Encounter (HOSPITAL_BASED_OUTPATIENT_CLINIC_OR_DEPARTMENT_OTHER): Payer: Self-pay | Admitting: Pulmonary Disease

## 2023-10-12 VITALS — BP 148/90 | HR 81 | Ht 66.0 in | Wt 283.2 lb

## 2023-10-12 DIAGNOSIS — Z9989 Dependence on other enabling machines and devices: Secondary | ICD-10-CM | POA: Diagnosis not present

## 2023-10-12 DIAGNOSIS — J4530 Mild persistent asthma, uncomplicated: Secondary | ICD-10-CM

## 2023-10-12 NOTE — Patient Instructions (Addendum)
 Mild persistent asthma --CONTINUE Wixela 100-50 mcg ONE puff ONCE a day --CONTINUE Albuterol AS NEEDED for wheezing  Asthma Action Plan Increase Albuterol for worsening shortness of breath, wheezing and cough. If you symptoms do not improve in 24-48 hours, please our office for evaluation and/or prednisone taper.  OSA on CPAP Last sleep study >10 years ago Current CPAP device since 2024 DME: [facility] Request sleep study from [facility] and/or from Dr. Fernand [facility]

## 2023-10-13 DIAGNOSIS — M25512 Pain in left shoulder: Secondary | ICD-10-CM | POA: Diagnosis not present

## 2023-10-13 DIAGNOSIS — Z5189 Encounter for other specified aftercare: Secondary | ICD-10-CM | POA: Diagnosis not present

## 2023-10-25 DIAGNOSIS — M25511 Pain in right shoulder: Secondary | ICD-10-CM | POA: Diagnosis not present

## 2023-10-25 DIAGNOSIS — M25611 Stiffness of right shoulder, not elsewhere classified: Secondary | ICD-10-CM | POA: Diagnosis not present

## 2023-10-25 DIAGNOSIS — M6281 Muscle weakness (generalized): Secondary | ICD-10-CM | POA: Diagnosis not present

## 2023-10-26 DIAGNOSIS — G4733 Obstructive sleep apnea (adult) (pediatric): Secondary | ICD-10-CM | POA: Diagnosis not present

## 2023-12-01 DIAGNOSIS — M19012 Primary osteoarthritis, left shoulder: Secondary | ICD-10-CM | POA: Diagnosis not present

## 2023-12-01 DIAGNOSIS — Z96611 Presence of right artificial shoulder joint: Secondary | ICD-10-CM | POA: Diagnosis not present

## 2023-12-01 DIAGNOSIS — Z5189 Encounter for other specified aftercare: Secondary | ICD-10-CM | POA: Diagnosis not present

## 2023-12-04 DIAGNOSIS — Z23 Encounter for immunization: Secondary | ICD-10-CM | POA: Diagnosis not present

## 2023-12-28 DIAGNOSIS — M6281 Muscle weakness (generalized): Secondary | ICD-10-CM | POA: Diagnosis not present

## 2023-12-28 DIAGNOSIS — M25512 Pain in left shoulder: Secondary | ICD-10-CM | POA: Diagnosis not present

## 2023-12-28 DIAGNOSIS — M25612 Stiffness of left shoulder, not elsewhere classified: Secondary | ICD-10-CM | POA: Diagnosis not present

## 2023-12-31 NOTE — Progress Notes (Addendum)
 Cody Blankenship                                          MRN: [id number]   03/13/2024   The [facility] Quality Team Specialist reviewed this patient medical record for the purposes of chart review for care gap closure. The following were reviewed: chart review for care gap closure-controlling blood pressure.    [facility] Quality Team

## 2024-01-04 DIAGNOSIS — M25512 Pain in left shoulder: Secondary | ICD-10-CM | POA: Diagnosis not present

## 2024-01-04 DIAGNOSIS — M25612 Stiffness of left shoulder, not elsewhere classified: Secondary | ICD-10-CM | POA: Diagnosis not present

## 2024-01-04 DIAGNOSIS — M6281 Muscle weakness (generalized): Secondary | ICD-10-CM | POA: Diagnosis not present

## 2024-01-12 DIAGNOSIS — Z96611 Presence of right artificial shoulder joint: Secondary | ICD-10-CM | POA: Diagnosis not present

## 2024-01-12 DIAGNOSIS — M19012 Primary osteoarthritis, left shoulder: Secondary | ICD-10-CM | POA: Diagnosis not present

## 2024-01-18 DIAGNOSIS — M25511 Pain in right shoulder: Secondary | ICD-10-CM | POA: Diagnosis not present

## 2024-01-18 DIAGNOSIS — M25512 Pain in left shoulder: Secondary | ICD-10-CM | POA: Diagnosis not present

## 2024-01-18 DIAGNOSIS — M6281 Muscle weakness (generalized): Secondary | ICD-10-CM | POA: Diagnosis not present

## 2024-01-18 DIAGNOSIS — M25611 Stiffness of right shoulder, not elsewhere classified: Secondary | ICD-10-CM | POA: Diagnosis not present

## 2024-01-18 DIAGNOSIS — M25612 Stiffness of left shoulder, not elsewhere classified: Secondary | ICD-10-CM | POA: Diagnosis not present

## 2024-01-24 DIAGNOSIS — M353 Polymyalgia rheumatica: Secondary | ICD-10-CM | POA: Diagnosis not present

## 2024-01-24 DIAGNOSIS — M25519 Pain in unspecified shoulder: Secondary | ICD-10-CM | POA: Diagnosis not present

## 2024-01-24 DIAGNOSIS — Z6841 Body Mass Index (BMI) 40.0 and over, adult: Secondary | ICD-10-CM | POA: Diagnosis not present

## 2024-01-24 DIAGNOSIS — M2560 Stiffness of unspecified joint, not elsewhere classified: Secondary | ICD-10-CM | POA: Diagnosis not present

## 2024-01-28 DIAGNOSIS — G4733 Obstructive sleep apnea (adult) (pediatric): Secondary | ICD-10-CM | POA: Diagnosis not present

## 2024-02-01 DIAGNOSIS — I10 Essential (primary) hypertension: Secondary | ICD-10-CM | POA: Diagnosis not present

## 2024-02-01 DIAGNOSIS — R7301 Impaired fasting glucose: Secondary | ICD-10-CM | POA: Diagnosis not present

## 2024-02-01 DIAGNOSIS — E785 Hyperlipidemia, unspecified: Secondary | ICD-10-CM | POA: Diagnosis not present

## 2024-02-01 DIAGNOSIS — Z6841 Body Mass Index (BMI) 40.0 and over, adult: Secondary | ICD-10-CM | POA: Diagnosis not present

## 2024-02-01 DIAGNOSIS — M19012 Primary osteoarthritis, left shoulder: Secondary | ICD-10-CM | POA: Diagnosis not present

## 2024-04-04 ENCOUNTER — Other Ambulatory Visit (HOSPITAL_BASED_OUTPATIENT_CLINIC_OR_DEPARTMENT_OTHER): Payer: Self-pay | Admitting: Pulmonary Disease

## 2024-04-20 ENCOUNTER — Ambulatory Visit (HOSPITAL_BASED_OUTPATIENT_CLINIC_OR_DEPARTMENT_OTHER): Admitting: Pulmonary Disease
